# Patient Record
Sex: Male | Born: 1960 | Race: White | Hispanic: No | Marital: Married | Smoking: Former smoker
Health system: Southern US, Community
[De-identification: ages and names within clinical notes are randomized; demographics above are authoritative.]

## PROBLEM LIST (undated history)

## (undated) DIAGNOSIS — N400 Enlarged prostate without lower urinary tract symptoms: Secondary | ICD-10-CM

## (undated) DIAGNOSIS — N179 Acute kidney failure, unspecified: Secondary | ICD-10-CM

## (undated) DIAGNOSIS — I10 Essential (primary) hypertension: Secondary | ICD-10-CM

## (undated) DIAGNOSIS — R0789 Other chest pain: Secondary | ICD-10-CM

## (undated) DIAGNOSIS — R0609 Other forms of dyspnea: Secondary | ICD-10-CM

## (undated) DIAGNOSIS — E785 Hyperlipidemia, unspecified: Secondary | ICD-10-CM

## (undated) HISTORY — DX: Other forms of dyspnea: R06.09

## (undated) HISTORY — PX: OTHER SURGICAL HISTORY: SHX169

## (undated) HISTORY — DX: Hyperlipidemia, unspecified: E78.5

## (undated) HISTORY — DX: Other chest pain: R07.89

## (undated) HISTORY — DX: Essential (primary) hypertension: I10

## (undated) HISTORY — DX: Benign prostatic hyperplasia without lower urinary tract symptoms: N40.0

---

## 2015-07-12 ENCOUNTER — Encounter: Payer: Self-pay | Admitting: Cardiovascular Disease

## 2015-07-12 ENCOUNTER — Ambulatory Visit (INDEPENDENT_AMBULATORY_CARE_PROVIDER_SITE_OTHER): Payer: Managed Care, Other (non HMO) | Admitting: Cardiovascular Disease

## 2015-07-12 VITALS — BP 129/85 | HR 69 | Ht 72.0 in | Wt 214.2 lb

## 2015-07-12 DIAGNOSIS — R079 Chest pain, unspecified: Secondary | ICD-10-CM

## 2015-07-12 DIAGNOSIS — I10 Essential (primary) hypertension: Secondary | ICD-10-CM

## 2015-07-12 DIAGNOSIS — E785 Hyperlipidemia, unspecified: Secondary | ICD-10-CM | POA: Diagnosis not present

## 2015-07-12 DIAGNOSIS — N400 Enlarged prostate without lower urinary tract symptoms: Secondary | ICD-10-CM | POA: Diagnosis not present

## 2015-07-12 HISTORY — DX: Benign prostatic hyperplasia without lower urinary tract symptoms: N40.0

## 2015-07-12 HISTORY — DX: Essential (primary) hypertension: I10

## 2015-07-12 NOTE — Progress Notes (Signed)
Cardiology Office Note   Date:  07/12/2015   ID:  Nicholas PostinWilliam A Hanger, DOB 02-07-1961, MRN [id number]  PCP:  No PCP Per Patient  Cardiologist:   Chilton Siiffany Plum, MD   Chief Complaint  Patient presents with  . New Patient (Initial Visit)    SOB;none. CHEST PAIN;tighting.LIGHTHEADED/DIZZINESS; none. PAIN OR CRAMPING IN LEGS; restless legs. EDEMA; none.      History of Present Illness: Nicholas Coleman is a 55 y.o. male with hypertension and hyperlipidemia who presents for and evaluation of chest discomfort.  In the last 3-4 weeks he has noted several episodes of mild, 1/10 chest discomfort.  The episodes occur and last for a few minutes at a time. There is no associated shortness of breath, lightheadedness, dizziness, nausea, vomiting or diaphoresis.  The chest discomfort does not radiate and is localized substernally.  It occurs both at rest and with exertion. He does not get much exercise but notes that he does a lot of walking at work, which is not make her.  Mr. Beulah GandyRudolph alternates between working day and night shifts every month.  When he works the night shift he does not use his CPAP.  He notes that he has been stressed lately as he recently [place] from [place] and got married.  He has also started a new job.  He wonders if these stressors may be the cause of his chest discomfort.  He denies lower extremity edema, orthopnea or PND.    Past Medical History  Diagnosis Date  . Hyperlipidemia   . Hypertension   . Essential hypertension 07/12/2015  . BPH (benign prostatic hyperplasia) 07/12/2015    No past surgical history on file.   Current Outpatient Prescriptions  Medication Sig Dispense Refill  . Digestive Enzymes (SIMILASE PO) Take 1 tablet by mouth 2 (two) times daily with a meal.    . hydrochlorothiazide (HYDRODIURIL) 25 MG tablet Take 1 tablet by mouth daily.    . Multiple Vitamin (MULTIVITAMIN) capsule Take 1 capsule by mouth daily.    . Omega-3 Fatty  Acids (FISH OIL PO) Take 2 capsules by mouth daily.    . Probiotic Product (PROBIOTIC DAILY PO) Take 1 tablet by mouth daily.    . simvastatin (ZOCOR) 20 MG tablet Take 1 tablet by mouth daily.    . tamsulosin (FLOMAX) 0.4 MG CAPS capsule      No current facility-administered medications for this visit.    Allergies:   Review of patient's allergies indicates no known allergies.    Social History:  The patient  reports that he has quit smoking. He does not have any smokeless tobacco history on file.   Family History:  The patient's family history includes CAD in his father; Lymphoma in his mother; Stroke in his father.    ROS:  Please see the history of present illness.   Otherwise, review of systems are positive for none.   All other systems are reviewed and negative.    PHYSICAL EXAM: VS:  BP 129/85 mmHg  Pulse 69  Ht 6' (1.829 m)  Wt 97.16 kg (214 lb 3.2 oz)  BMI 29.04 kg/m2 , BMI Body mass index is 29.04 kg/(m^2). GENERAL:  Well appearing HEENT:  Pupils equal round and reactive, fundi not visualized, oral mucosa unremarkable NECK:  No jugular venous distention, waveform within normal limits, carotid upstroke brisk and symmetric, no bruits, no thyromegaly LYMPHATICS:  No cervical adenopathy LUNGS:  Clear to auscultation bilaterally HEART:  RRR.  PMI not displaced or sustained,S1 and S2 within normal limits, no S3, no S4, no clicks, no rubs, no murmurs ABD:  Flat, positive bowel sounds normal in frequency in pitch, no bruits, no rebound, no guarding, no midline pulsatile mass, no hepatomegaly, no splenomegaly EXT:  2 plus pulses throughout, no edema, no cyanosis no clubbing SKIN:  No rashes no nodules NEURO:  Cranial nerves II through XII grossly intact, motor grossly intact throughout PSYCH:  Cognitively intact, oriented to person place and time    EKG:  EKG is ordered today. The ekg ordered today demonstrates sinus rhythm. Rate 69 bpm.   Recent Labs: No results found  for requested labs within last 365 days.    Lipid Panel No results found for: CHOL, TRIG, HDL, CHOLHDL, VLDL, LDLCALC, LDLDIRECT    Wt Readings from Last 3 Encounters:  07/12/15 97.16 kg (214 lb 3.2 oz)      ASSESSMENT AND PLAN:  # Atypical chest pain: Mr. Schuman's chest pain is atypical for coronary artery disease. However, he has risk factors including hypertension and hyperlipidemia. We will obtain a treadmill stress test to evaluate for ischemia.  # Hypertension: Blood pressure is well-controlled on hydrochlorothiazide.  # Hyperlipidemia: Continue simvastatin. His lipids are managed by his PCP.   # OSA: Patient was encouraged to wear his CPAP whenever sleeping.    Current medicines are reviewed at length with the patient today.  The patient does not have concerns regarding medicines.  The following changes have been made:  no change  Labs/ tests ordered today include:   Orders Placed This Encounter  Procedures  . CBC with Differential/Platelet  . Lipid Profile  . Comprehensive Metabolic Panel (CMET)  . Exercise Tolerance Test  . EKG 12-Lead     Disposition:   FU with Phyllis Whitefield C. Duke Salvia, MD, [facility] in 1 year   This note was written with the assistance of speech recognition software.  Please excuse any transcriptional errors.  Signed, Keara Pagliarulo C. Duke Salvia, MD, [facility]  07/12/2015 1:44 PM    [facility]

## 2015-07-12 NOTE — Patient Instructions (Addendum)
Medication Instructions:  Your physician recommends that you continue on your current medications as directed. Please refer to the Current Medication list given to you today.  Labwork: FASTING CBC/LP/CMET AT [facility] ON THE FIRST FLOOR SOON  Testing/Procedures: Your physician has requested that you have an exercise tolerance test. For further information please visit [email or web address] Please also follow instruction sheet, as given.  Follow-Up: Your physician wants you to follow-up in: 1 YEAR OV You will receive a reminder letter in the mail two months in advance. If you don't receive a letter, please call our office to schedule the follow-up appointment.  If you need a refill on your cardiac medications before your next appointment, please call your pharmacy.

## 2015-07-22 ENCOUNTER — Telehealth (HOSPITAL_COMMUNITY): Payer: Self-pay

## 2015-07-22 NOTE — Telephone Encounter (Signed)
Encounter complete. 

## 2015-07-23 ENCOUNTER — Telehealth: Payer: Self-pay | Admitting: Cardiovascular Disease

## 2015-07-23 DIAGNOSIS — R079 Chest pain, unspecified: Secondary | ICD-10-CM

## 2015-07-23 NOTE — Telephone Encounter (Signed)
[facility] orders created for patient at his request, patient aware.

## 2015-07-23 NOTE — Telephone Encounter (Signed)
Nicholas Coleman is asking that a Lab Order be sent to [name] on [address] , he has an appointment with them on 07/27/15 at 8am . Please call if you have any questions .   Thanks

## 2015-07-27 ENCOUNTER — Ambulatory Visit (HOSPITAL_COMMUNITY)
Admission: RE | Admit: 2015-07-27 | Discharge: 2015-07-27 | Disposition: A | Discharge status: Home / Self Care | Payer: Managed Care, Other (non HMO) | Source: Ambulatory Visit | Attending: Internal Medicine | Admitting: Internal Medicine

## 2015-07-27 DIAGNOSIS — R079 Chest pain, unspecified: Secondary | ICD-10-CM | POA: Diagnosis not present

## 2015-07-27 LAB — EXERCISE TOLERANCE TEST
CHL CUP MPHR: 164 {beats}/min
CHL CUP RESTING HR STRESS: 74 {beats}/min
CHL RATE OF PERCEIVED EXERTION: 17
CSEPEDS: 1 s
CSEPHR: 94 %
CSEPPHR: 155 {beats}/min
Estimated workload: 13.4 METS
Exercise duration (min): 12 min

## 2015-07-28 LAB — COMPREHENSIVE METABOLIC PANEL
A/G RATIO: 1.9 (ref 1.2–2.2)
ALBUMIN: 4.6 g/dL (ref 3.5–5.5)
ALT: 31 IU/L (ref 0–44)
AST: 24 IU/L (ref 0–40)
Alkaline Phosphatase: 50 IU/L (ref 39–117)
BILIRUBIN TOTAL: 0.6 mg/dL (ref 0.0–1.2)
BUN / CREAT RATIO: 19 (ref 9–20)
BUN: 23 mg/dL (ref 6–24)
CHLORIDE: 98 mmol/L (ref 96–106)
CO2: 26 mmol/L (ref 18–29)
Calcium: 9.3 mg/dL (ref 8.7–10.2)
Creatinine, Ser: 1.19 mg/dL (ref 0.76–1.27)
GFR calc non Af Amer: 68 mL/min/{1.73_m2} (ref >59)
GFR, EST AFRICAN AMERICAN: 78 mL/min/{1.73_m2} (ref >59)
GLOBULIN, TOTAL: 2.4 g/dL (ref 1.5–4.5)
Glucose: 101 mg/dL — ABNORMAL HIGH (ref 65–99)
POTASSIUM: 4.4 mmol/L (ref 3.5–5.2)
SODIUM: 140 mmol/L (ref 134–144)
TOTAL PROTEIN: 7 g/dL (ref 6.0–8.5)

## 2015-07-28 LAB — CBC WITH DIFFERENTIAL/PLATELET
BASOS ABS: 0 10*3/uL (ref 0.0–0.2)
Basos: 0 %
EOS (ABSOLUTE): 0.3 10*3/uL (ref 0.0–0.4)
Eos: 5 %
HEMOGLOBIN: 16.2 g/dL (ref 12.6–17.7)
Hematocrit: 46 % (ref 37.5–51.0)
Immature Grans (Abs): 0 10*3/uL (ref 0.0–0.1)
Immature Granulocytes: 1 %
LYMPHS ABS: 1.8 10*3/uL (ref 0.7–3.1)
Lymphs: 30 %
MCH: 31 pg (ref 26.6–33.0)
MCHC: 35.2 g/dL (ref 31.5–35.7)
MCV: 88 fL (ref 79–97)
MONOCYTES: 8 %
Monocytes Absolute: 0.5 10*3/uL (ref 0.1–0.9)
NEUTROS ABS: 3.3 10*3/uL (ref 1.4–7.0)
Neutrophils: 56 %
Platelets: 198 10*3/uL (ref 150–379)
RBC: 5.23 x10E6/uL (ref 4.14–5.80)
RDW: 13 % (ref 12.3–15.4)
WBC: 5.9 10*3/uL (ref 3.4–10.8)

## 2015-07-28 LAB — LIPID PANEL
CHOL/HDL RATIO: 3.3 ratio (ref 0.0–5.0)
CHOLESTEROL TOTAL: 225 mg/dL — AB (ref 100–199)
HDL: 69 mg/dL (ref >39)
LDL Calculated: 143 mg/dL — ABNORMAL HIGH (ref 0–99)
Triglycerides: 65 mg/dL (ref 0–149)
VLDL Cholesterol Cal: 13 mg/dL (ref 5–40)

## 2015-08-03 ENCOUNTER — Telehealth: Payer: Self-pay | Admitting: *Deleted

## 2015-08-03 NOTE — Telephone Encounter (Signed)
-----   Message from Chilton Siiffany Oneida, MD sent at 07/28/2015  8:47 PM EDT ----- Kidney function and electrolytes are normal.  Normal blood counts.  Cholesterol levels are elevated.  Recommend increasing simvastatin to 40mg .  Check lipids and LFTs in 6 weeks.

## 2015-11-22 DIAGNOSIS — F5232 Male orgasmic disorder: Secondary | ICD-10-CM | POA: Insufficient documentation

## 2015-11-22 DIAGNOSIS — N529 Male erectile dysfunction, unspecified: Secondary | ICD-10-CM | POA: Insufficient documentation

## 2015-12-22 DIAGNOSIS — K642 Third degree hemorrhoids: Secondary | ICD-10-CM | POA: Insufficient documentation

## 2017-08-07 ENCOUNTER — Other Ambulatory Visit: Payer: Self-pay | Admitting: Gastroenterology

## 2017-08-07 DIAGNOSIS — R1011 Right upper quadrant pain: Secondary | ICD-10-CM

## 2017-08-21 ENCOUNTER — Encounter (HOSPITAL_COMMUNITY)
Admission: RE | Admit: 2017-08-21 | Discharge: 2017-08-21 | Disposition: A | Discharge status: Home / Self Care | Payer: Managed Care, Other (non HMO) | Source: Ambulatory Visit | Attending: Gastroenterology | Admitting: Gastroenterology

## 2017-08-21 DIAGNOSIS — R1011 Right upper quadrant pain: Secondary | ICD-10-CM | POA: Diagnosis not present

## 2017-08-21 MED ORDER — TECHNETIUM TC 99M MEBROFENIN IV KIT
5.0000 | PACK | Freq: Once | INTRAVENOUS | Status: AC | PRN
  Administered 2017-08-21: 5 via INTRAVENOUS

## 2017-11-06 ENCOUNTER — Other Ambulatory Visit: Payer: Self-pay | Admitting: Gastroenterology

## 2017-11-06 DIAGNOSIS — R112 Nausea with vomiting, unspecified: Secondary | ICD-10-CM

## 2017-11-19 ENCOUNTER — Ambulatory Visit
Admission: RE | Admit: 2017-11-19 | Discharge: 2017-11-19 | Disposition: A | Discharge status: Home / Self Care | Payer: Managed Care, Other (non HMO) | Source: Ambulatory Visit | Attending: Gastroenterology | Admitting: Gastroenterology

## 2017-11-19 DIAGNOSIS — R112 Nausea with vomiting, unspecified: Secondary | ICD-10-CM

## 2017-11-19 MED ORDER — IOPAMIDOL (ISOVUE-300) INJECTION 61%
100.0000 mL | Freq: Once | INTRAVENOUS | Status: AC | PRN
  Administered 2017-11-19: 100 mL via INTRAVENOUS

## 2017-12-07 NOTE — Telephone Encounter (Signed)
Notes Recorded by Burnell Blanks on 08/03/2015 at 11:37 AM Advised patient of lab results  He does not know that he wants to come back for a recheck regarding labs after making the medication change Explained to him that the increased Simvastatin could effect the liver and need to monitor to avoid damaging the liver Patient stated he would think about it and call back ------

## 2017-12-11 ENCOUNTER — Encounter: Payer: Self-pay | Admitting: Cardiovascular Disease

## 2017-12-11 ENCOUNTER — Ambulatory Visit: Payer: Managed Care, Other (non HMO) | Admitting: Cardiovascular Disease

## 2017-12-11 VITALS — BP 113/77 | HR 83 | Ht 72.0 in | Wt 224.0 lb

## 2017-12-11 DIAGNOSIS — I10 Essential (primary) hypertension: Secondary | ICD-10-CM | POA: Diagnosis not present

## 2017-12-11 DIAGNOSIS — R0789 Other chest pain: Secondary | ICD-10-CM

## 2017-12-11 DIAGNOSIS — R0602 Shortness of breath: Secondary | ICD-10-CM | POA: Diagnosis not present

## 2017-12-11 DIAGNOSIS — E7800 Pure hypercholesterolemia, unspecified: Secondary | ICD-10-CM | POA: Diagnosis not present

## 2017-12-11 DIAGNOSIS — R0600 Dyspnea, unspecified: Secondary | ICD-10-CM

## 2017-12-11 DIAGNOSIS — R0609 Other forms of dyspnea: Secondary | ICD-10-CM | POA: Insufficient documentation

## 2017-12-11 HISTORY — DX: Other forms of dyspnea: R06.09

## 2017-12-11 HISTORY — DX: Other chest pain: R07.89

## 2017-12-11 HISTORY — DX: Dyspnea, unspecified: R06.00

## 2017-12-11 MED ORDER — METOPROLOL TARTRATE 100 MG PO TABS: ORAL_TABLET | ORAL | 0 refills | Status: DC

## 2017-12-11 NOTE — Patient Instructions (Addendum)
Medication Instructions:  DECREASE YOUR HYDROCHLOROTHIAZIDE TO 25 MG 1/2 TABLET DAILY   TAKE METOPROLOL 100 MG 2 HOURS PRIOR TO CARDIAC CT  Labwork: BMET 1 WEEK PRIOR TO CT   Testing/Procedures: Your physician has requested that you have cardiac CT. Cardiac computed tomography (CT) is a painless test that uses an x-ray machine to take clear, detailed pictures of your heart. For further information please visit [email or web address] Please follow instruction sheet as given. THE OFFICE WILL CALL YOU TO SCHEDULE ONCE THIS HAS BEEN APPROVED BY YOUR INSURANCE   Your physician has requested that you have an echocardiogram. Echocardiography is a painless test that uses sound waves to create images of your heart. It provides your doctor with information about the size and shape of your heart and how well your heart's chambers and valves are working. This procedure takes approximately one hour. There are no restrictions for this procedure. AT [place] DAY YOU HAVE YOUR CT  Follow-Up: Your physician recommends that you schedule a follow-up appointment in: 6 WEEKS   Any Other Special Instructions Will Be Listed Below (If Applicable).  MONITOR YOUR AND LOG YOUR BLOOD PRESSURE, BRING READINGS TO FOLLOW UP   Please arrive at the [facility] main entrance of [facility] at xx:xx AM (30-45 minutes prior to test start time)  [facility] [address] [place], [place] [address] [phone]  Proceed to the [facility] Radiology Department (First Floor).  Please follow these instructions carefully (unless otherwise directed):  Hold all erectile dysfunction medications at least 48 hours prior to test.  On the Night Before the Test: . Be sure to Drink plenty of water. . Do not consume any caffeinated/decaffeinated beverages or chocolate 12 hours prior to your test. . Do not take any antihistamines 12 hours prior to your test. . If you take Metformin do not take 24 hours prior  to test. . If the patient has contrast allergy: ? Patient will need a prescription for Prednisone and very clear instructions (as follows): 1. Prednisone 50 mg - take 13 hours prior to test 2. Take another Prednisone 50 mg 7 hours prior to test 3. Take another Prednisone 50 mg 1 hour prior to test 4. Take Benadryl 50 mg 1 hour prior to test . Patient must complete all four doses of above prophylactic medications. . Patient will need a ride after test due to Benadryl.  On the Day of the Test: . Drink plenty of water. Do not drink any water within one hour of the test. . Do not eat any food 4 hours prior to the test. . You may take your regular medications prior to the test. . IF NOT ON BETA BLOCKER-Take 100 mg of Lopressor (metoprolol) TWO HOURS before test  After the Test: . Drink plenty of water. . After receiving IV contrast, you may experience a mild flushed feeling. This is normal. . On occasion, you may experience a mild rash up to 24 hours after the test. This is not dangerous. If this occurs, you can take Benadryl 25 mg and increase your fluid intake. . If you experience trouble breathing, this can be serious. If it is severe call 911 IMMEDIATELY. If it is mild, please call our office. . If you take any of these medications: Glipizide/Metformin, Avandament, Glucavance, please do not take 48 hours after completing test.    Cardiac CT Angiogram A cardiac CT angiogram is a procedure to look at the heart and the area around the  heart. It may be done to help find the cause of chest pains or other symptoms of heart disease. During this procedure, a large X-ray machine, called a CT scanner, takes detailed pictures of the heart and the surrounding area after a dye (contrast material) has been injected into blood vessels in the area. The procedure is also sometimes called a coronary CT angiogram, coronary artery scanning, or CTA. A cardiac CT angiogram allows the health care provider to see  how well blood is flowing to and from the heart. The health care provider will be able to see if there are any problems, such as:  Blockage or narrowing of the coronary arteries in the heart.  Fluid around the heart.  Signs of weakness or disease in the muscles, valves, and tissues of the heart.  Tell a health care provider about:  Any allergies you have. This is especially important if you have had a previous allergic reaction to contrast dye.  All medicines you are taking, including vitamins, herbs, eye drops, creams, and over-the-counter medicines.  Any blood disorders you have.  Any surgeries you have had.  Any medical conditions you have.  Whether you are pregnant or may be pregnant.  Any anxiety disorders, chronic pain, or other conditions you have that may increase your stress or prevent you from lying still. What are the risks? Generally, this is a safe procedure. However, problems may occur, including:  Bleeding.  Infection.  Allergic reactions to medicines or dyes.  Damage to other structures or organs.  Kidney damage from the dye or contrast that is used.  Increased risk of cancer from radiation exposure. This risk is low. Talk with your health care provider about: ? The risks and benefits of testing. ? How you can receive the lowest dose of radiation.  What happens before the procedure?  Wear comfortable clothing and remove any jewelry, glasses, dentures, and hearing aids.  Follow instructions from your health care provider about eating and drinking. This may include: ? For 12 hours before the test - avoid caffeine. This includes tea, coffee, soda, energy drinks, and diet pills. Drink plenty of water or other fluids that do not have caffeine in them. Being well-hydrated can prevent complications. ? For 4-6 hours before the test - stop eating and drinking. The contrast dye can cause nausea, but this is less likely if your stomach is empty.  Ask your health  care provider about changing or stopping your regular medicines. This is especially important if you are taking diabetes medicines, blood thinners, or medicines to treat erectile dysfunction. What happens during the procedure?  Hair on your chest may need to be removed so that small sticky patches called electrodes can be placed on your chest. These will transmit information that helps to monitor your heart during the test.  An IV tube will be inserted into one of your veins.  You might be given a medicine to control your heart rate during the test. This will help to ensure that good images are obtained.  You will be asked to lie on an exam table. This table will slide in and out of the CT machine during the procedure.  Contrast dye will be injected into the IV tube. You might feel warm, or you may get a metallic taste in your mouth.  You will be given a medicine (nitroglycerin) to relax (dilate) the arteries in your heart.  The table that you are lying on will move into the CT machine tunnel  for the scan.  The person running the machine will give you instructions while the scans are being done. You may be asked to: ? Keep your arms above your head. ? Hold your breath. ? Stay very still, even if the table is moving.  When the scanning is complete, you will be moved out of the machine.  The IV tube will be removed. The procedure may vary among health care providers and hospitals. What happens after the procedure?  You might feel warm, or you may get a metallic taste in your mouth from the contrast dye.  You may have a headache from the nitroglycerin.  After the procedure, drink water or other fluids to wash (flush) the contrast material out of your body.  Contact a health care provider if you have any symptoms of allergy to the contrast. These symptoms include: ? Shortness of breath. ? Rash or hives. ? A racing heartbeat.  Most people can return to their normal activities right  after the procedure. Ask your health care provider what activities are safe for you.  It is up to you to get the results of your procedure. Ask your health care provider, or the department that is doing the procedure, when your results will be ready. Summary  A cardiac CT angiogram is a procedure to look at the heart and the area around the heart. It may be done to help find the cause of chest pains or other symptoms of heart disease.  During this procedure, a large X-ray machine, called a CT scanner, takes detailed pictures of the heart and the surrounding area after a dye (contrast material) has been injected into blood vessels in the area.  Ask your health care provider about changing or stopping your regular medicines before the procedure. This is especially important if you are taking diabetes medicines, blood thinners, or medicines to treat erectile dysfunction.  After the procedure, drink water or other fluids to wash (flush) the contrast material out of your body. This information is not intended to replace advice given to you by your health care provider. Make sure you discuss any questions you have with your health care provider. Document Released: 02/10/2008 Document Revised: 01/17/2016 Document Reviewed: 01/17/2016 Elsevier Interactive Patient Education  2017 [employer] Inc.   Echocardiogram An echocardiogram, or echocardiography, uses sound waves (ultrasound) to produce an image of your heart. The echocardiogram is simple, painless, obtained within a short period of time, and offers valuable information to your health care provider. The images from an echocardiogram can provide information such as:  Evidence of coronary artery disease (CAD).  Heart size.  Heart muscle function.  Heart valve function.  Aneurysm detection.  Evidence of a past heart attack.  Fluid buildup around the heart.  Heart muscle thickening.  Assess heart valve function.  Tell a health care  provider about:  Any allergies you have.  All medicines you are taking, including vitamins, herbs, eye drops, creams, and over-the-counter medicines.  Any problems you or family members have had with anesthetic medicines.  Any blood disorders you have.  Any surgeries you have had.  Any medical conditions you have.  Whether you are pregnant or may be pregnant. What happens before the procedure? No special preparation is needed. Eat and drink normally. What happens during the procedure?  In order to produce an image of your heart, gel will be applied to your chest and a wand-like tool (transducer) will be moved over your chest. The gel will help transmit  the sound waves from the transducer. The sound waves will harmlessly bounce off your heart to allow the heart images to be captured in real-time motion. These images will then be recorded.  You may need an IV to receive a medicine that improves the quality of the pictures. What happens after the procedure? You may return to your normal schedule including diet, activities, and medicines, unless your health care provider tells you otherwise. This information is not intended to replace advice given to you by your health care provider. Make sure you discuss any questions you have with your health care provider. Document Released: 02/25/2000 Document Revised: 10/16/2015 Document Reviewed: 11/04/2012 Elsevier Interactive Patient Education  2017 [employer].

## 2017-12-11 NOTE — Progress Notes (Signed)
Cardiology Office Note   Date:  12/11/2017   ID:  Nicholas Coleman, DOB [birth year], MRN [id number]  PCP:  Wilfrid Lund, PA  Cardiologist:   Chilton Si, MD   Chief Complaint  Patient presents with  . Follow-up     History of Present Illness: Nicholas Coleman is a 57 y.o. male with hypertension and hyperlipidemia who presents for follow up.  He was initially seen 07/2015 for the evaluation of chest discomfort.  His symptoms were atypical.  However given that he had risk factors he was referred for an ETT 07/2015 that was negative for ischemia.  He achieved 13.4 METS on a Bruce protocol.  At that time lipids were checked and it was recommended that he increase his simvastatin.  He never returned for follow-up testing.  For the last several months Nicholas Coleman has developed new symptoms.  He has been dealing with abdominal discomfort.  It first occurred when he started doing a low-fat carb high fat diet.  He noted that his gallbladder "flared up."  He stopped that diet and since then he has had diffuse abdominal pain.  He gets pain and nauseous before eating.  After he eats he feels very bloated.  He had an abdominal ultrasound and CT that did not reveal any significant pathology.  He reports that his GI labs have been unremarkable.  He is scheduled to have an upper and lower endoscopy next week.  His primary care provider tried him on a PPI but he has not noted any changes in his symptoms.  He has no melena or hematochezia.  For the last 3 months he is also noted that he is more lightheaded when he bends over and stands up or bends over.  He does not check his blood pressure at home but notes that it is always well-controlled when he sees a provider.  He is noted increased shortness of breath especially when going up and down steps.  He walks over 10,000 steps each day at work.  He has substernal chest pressure that sometimes happens with exertion and sometimes at rest.  He is always  sweating a lot but notices that he has more diaphoresis with exertion than usual.   Past Medical History:  Diagnosis Date  . Atypical chest pain 12/11/2017  . BPH (benign prostatic hyperplasia) 07/12/2015  . Essential hypertension 07/12/2015  . Exertional dyspnea 12/11/2017  . Hyperlipidemia   . Hypertension     History reviewed. No pertinent surgical history.   Current Outpatient Medications  Medication Sig Dispense Refill  . Boswellia-Glucosamine-Vit D (GLUCOSAMINE COMPLEX PO) Take by mouth.    . Coenzyme Q10 (COQ10 PO) Take by mouth daily.    . hydrochlorothiazide (HYDRODIURIL) 25 MG tablet Take 1 tablet by mouth as directed. TAKE 1/2 TABLET BY MOUTH DAILY    . MAGNESIUM MALATE PO Take by mouth.    . Multiple Vitamin (MULTIVITAMIN) capsule Take 1 capsule by mouth daily.    . Omega-3 Fatty Acids (FISH OIL PO) Take 2 capsules by mouth daily.    . simvastatin (ZOCOR) 40 MG tablet     . metoprolol tartrate (LOPRESSOR) 100 MG tablet TAKE 1 TABLET 2 HOURS PRIOR TO CT 1 tablet 0   No current facility-administered medications for this visit.     Allergies:   Patient has no known allergies.    Social History:  The patient  reports that he has quit smoking. He has never used smokeless tobacco.  Family History:  The patient's family history includes CAD in his father; Lymphoma in his mother; Stroke in his father.    ROS:  Please see the history of present illness.   Otherwise, review of systems are positive for none.   All other systems are reviewed and negative.    PHYSICAL EXAM: VS:  BP 113/77   Pulse 83   Ht 6' (1.829 m)   Wt 224 lb (101.6 kg)   BMI 30.38 kg/m  , BMI Body mass index is 30.38 kg/m. GENERAL:  Well appearing HEENT:  Pupils equal round and reactive, fundi not visualized, oral mucosa unremarkable NECK:  No jugular venous distention, waveform within normal limits, carotid upstroke brisk and symmetric, no bruits, no thyromegaly LYMPHATICS:  No cervical  adenopathy LUNGS:  Clear to auscultation bilaterally HEART:  RRR.  PMI not displaced or sustained,S1 and S2 within normal limits, no S3, no S4, no clicks, no rubs, no murmurs ABD:  Flat, positive bowel sounds normal in frequency in pitch, no bruits, no rebound, no guarding, no midline pulsatile mass, no hepatomegaly, no splenomegaly EXT:  2 plus pulses throughout, no edema, no cyanosis no clubbing SKIN:  No rashes no nodules NEURO:  Cranial nerves II through XII grossly intact, motor grossly intact throughout PSYCH:  Cognitively intact, oriented to person place and time   EKG:  EKG is ordered today. The ekg ordered 07/12/2015 demonstrates sinus rhythm. Rate 69 bpm. 12/11/2017: Sinus rhythm.  Rate 83 bpm.  ETT 07/27/15:  Blood pressure demonstrated a hypotensive response to exercise.  Upsloping ST segment depression ST segment depression was noted during stress in the II, III, aVF, V4, V5 and V6 leads.  Excellent exercise capacity.  Negative, adequate stress test.  Recent Labs: No results found for requested labs within last 8760 hours.    Lipid Panel    Component Value Date/Time   CHOL 225 (H) 07/27/2015 0752   TRIG 65 07/27/2015 0752   HDL 69 07/27/2015 0752   CHOLHDL 3.3 07/27/2015 0752   LDLCALC 143 (H) 07/27/2015 0752      Wt Readings from Last 3 Encounters:  12/11/17 224 lb (101.6 kg)  07/12/15 214 lb 3.2 oz (97.2 kg)      ASSESSMENT AND PLAN:  # Atypical chest pain: Symptoms are concerning for angina.  However he also has symptoms at rest.  He previously had an ETT in 2017 that was unremarkable.  We will get a coronary CT-a to assess his coronaries.  # Shortness of breath: Check echo and cardiac CT-A.  He is euvolemic on exam.  # Hypertension:  # Orthostatic symptoms: Blood pressure is well-controlled on hydrochlorothiazide.  However he has orthostatic symptoms.  We will reduce hydrochlorothiazide to 12.5 mg and he will check his blood pressure at home.  His  goal is less than 130/80.  # Hyperlipidemia: Continue simvastatin. We will get a copy of his lipids from his PCP.  # OSA: Patient was encouraged to wear his CPAP whenever sleeping.    Current medicines are reviewed at length with the patient today.  The patient does not have concerns regarding medicines.  The following changes have been made:  no change  Labs/ tests ordered today include:   Orders Placed This Encounter  Procedures  . CT CORONARY MORPH W/CTA COR W/SCORE W/CA W/CM &/OR WO/CM  . CT CORONARY FRACTIONAL FLOW RESERVE DATA PREP  . CT CORONARY FRACTIONAL FLOW RESERVE FLUID ANALYSIS  . Basic metabolic panel  . EKG 12-Lead  .  ECHOCARDIOGRAM COMPLETE     Disposition:   FU with Brytney Somes C. Duke Salvia, MD, [facility] in 6 weeks.    Signed, Angelys Yetman C. Duke Salvia, MD, [facility]  12/11/2017 12:37 PM     [facility]

## 2017-12-12 ENCOUNTER — Other Ambulatory Visit: Payer: Self-pay

## 2017-12-12 ENCOUNTER — Ambulatory Visit (HOSPITAL_COMMUNITY): Payer: Managed Care, Other (non HMO) | Attending: Cardiology

## 2017-12-12 DIAGNOSIS — R0602 Shortness of breath: Secondary | ICD-10-CM | POA: Insufficient documentation

## 2017-12-12 DIAGNOSIS — R0789 Other chest pain: Secondary | ICD-10-CM | POA: Insufficient documentation

## 2017-12-18 ENCOUNTER — Other Ambulatory Visit: Payer: Self-pay | Admitting: Gastroenterology

## 2017-12-18 DIAGNOSIS — R112 Nausea with vomiting, unspecified: Secondary | ICD-10-CM

## 2018-01-08 ENCOUNTER — Encounter (HOSPITAL_COMMUNITY): Payer: Managed Care, Other (non HMO)

## 2018-01-22 ENCOUNTER — Encounter: Payer: Self-pay | Admitting: Cardiovascular Disease

## 2018-01-22 ENCOUNTER — Ambulatory Visit: Payer: Managed Care, Other (non HMO) | Admitting: Cardiovascular Disease

## 2018-01-22 VITALS — BP 110/72 | HR 92 | Ht 72.0 in | Wt 230.6 lb

## 2018-01-22 DIAGNOSIS — I10 Essential (primary) hypertension: Secondary | ICD-10-CM

## 2018-01-22 DIAGNOSIS — R0789 Other chest pain: Secondary | ICD-10-CM

## 2018-01-22 DIAGNOSIS — R0602 Shortness of breath: Secondary | ICD-10-CM | POA: Diagnosis not present

## 2018-01-22 DIAGNOSIS — E7800 Pure hypercholesterolemia, unspecified: Secondary | ICD-10-CM | POA: Diagnosis not present

## 2018-01-22 NOTE — Progress Notes (Signed)
Cardiology Office Note   Date:  01/22/2018   ID:  Nicholas Coleman, DOB November 14, 1960, MRN [id number]  PCP:  Wilfrid LundBecker, Anna G, PA  Cardiologist:   Chilton Siiffany Palmer, MD   Chief Complaint  Patient presents with  . Follow-up     History of Present Illness: Nicholas Coleman is a 57 y.o. male with hypertension and hyperlipidemia who presents for follow up.  He was initially seen 07/2015 for the evaluation of chest discomfort.  His symptoms were atypical.  However given that he had risk factors he was referred for an ETT 07/2015 that was negative for ischemia.  He achieved 13.4 METS on a Bruce protocol.  At that time lipids were checked and it was recommended that he increase his simvastatin.  He never returned for follow-up testing.  He followed up 12/2017 at which time he was dealing with GI distress after changing his diet.  He had an abdominal ultrasound and CT that did not reveal any significant pathology.  He followed up with GI and has been doing better lately.  At that appointment he also complained of lightheadedness and dizziness after bending over and standing up.  He reported exertional dyspnea.  He was referred for coronary CT-A but never had it due to lack of insurance approval.  He did have an echo 12/2017 that revealed LVEF 55 to 60% with grade 1 diastolic dysfunction and mild LVH.  Due to his orthostatic symptoms hydrochlorothiazide was reduced to 12.5 mg.  He is no longer lightheaded or dizzy.  He has been exercising regularly without exertional dyspnea or chest pain.  He has no lower extremity edema, orthopnea, or PND.  He plans to start a new diet soon.  It will start with 10 days of detox from carbohydrates.  Past Medical History:  Diagnosis Date  . Atypical chest pain 12/11/2017  . BPH (benign prostatic hyperplasia) 07/12/2015  . Essential hypertension 07/12/2015  . Exertional dyspnea 12/11/2017  . Hyperlipidemia   . Hypertension     No past surgical history on file.   Current  Outpatient Medications  Medication Sig Dispense Refill  . Boswellia-Glucosamine-Vit D (GLUCOSAMINE COMPLEX PO) Take by mouth.    . Coenzyme Q10 (COQ10 PO) Take by mouth daily.    . hydrochlorothiazide (HYDRODIURIL) 25 MG tablet Take 1 tablet by mouth as directed. TAKE 1/2 TABLET BY MOUTH DAILY    . MAGNESIUM MALATE PO Take by mouth.    . Multiple Vitamin (MULTIVITAMIN) capsule Take 1 capsule by mouth daily.    . Omega-3 Fatty Acids (FISH OIL PO) Take 2 capsules by mouth daily.    . simvastatin (ZOCOR) 40 MG tablet      No current facility-administered medications for this visit.     Allergies:   Patient has no known allergies.    Social History:  The patient  reports that he has quit smoking. He has never used smokeless tobacco.   Family History:  The patient's family history includes CAD in his father; Lymphoma in his mother; Stroke in his father.    ROS:  Please see the history of present illness.   Otherwise, review of systems are positive for none.   All other systems are reviewed and negative.    PHYSICAL EXAM: VS:  BP 110/72   Pulse 92   Ht 6' (1.829 m)   Wt 230 lb 9.6 oz (104.6 kg)   BMI 31.27 kg/m  , BMI Body mass index is 31.27 kg/m. GENERAL:  Well appearing HEENT: Pupils equal round and reactive, fundi not visualized, oral mucosa unremarkable NECK:  No jugular venous distention, waveform within normal limits, carotid upstroke brisk and symmetric, no bruits LUNGS:  Clear to auscultation bilaterally HEART:  RRR.  PMI not displaced or sustained,S1 and S2 within normal limits, no S3, no S4, no clicks, no rubs, no murmurs ABD:  Flat, positive bowel sounds normal in frequency in pitch, no bruits, no rebound, no guarding, no midline pulsatile mass, no hepatomegaly, no splenomegaly EXT:  2 plus pulses throughout, no edema, no cyanosis no clubbing SKIN:  No rashes no nodules NEURO:  Cranial nerves II through XII grossly intact, motor grossly intact throughout PSYCH:   Cognitively intact, oriented to person place and time   EKG:  EKG is ordered today. The ekg ordered 07/12/2015 demonstrates sinus rhythm. Rate 69 bpm. 12/11/2017: Sinus rhythm.  Rate 83 bpm.  ETT 07/27/15:  Blood pressure demonstrated a hypotensive response to exercise.  Upsloping ST segment depression ST segment depression was noted during stress in the II, III, aVF, V4, V5 and V6 leads.  Excellent exercise capacity.  Negative, adequate stress test.  Echo 12/12/17: Study Conclusions  - Left ventricle: The cavity size was normal. Wall thickness was   increased in a pattern of mild LVH. Systolic function was normal.   The estimated ejection fraction was in the range of 55% to 60%.   Wall motion was normal; there were no regional wall motion   abnormalities. Doppler parameters are consistent with abnormal   left ventricular relaxation (grade 1 diastolic dysfunction).  Impressions:  - Normal LV function; mild LVH; mild diastolic dysfunction.  Recent Labs: No results found for requested labs within last 8760 hours.    Lipid Panel    Component Value Date/Time   CHOL 225 (H) 07/27/2015 0752   TRIG 65 07/27/2015 0752   HDL 69 07/27/2015 0752   CHOLHDL 3.3 07/27/2015 0752   LDLCALC 143 (H) 07/27/2015 0752      Wt Readings from Last 3 Encounters:  01/22/18 230 lb 9.6 oz (104.6 kg)  12/11/17 224 lb (101.6 kg)  07/12/15 214 lb 3.2 oz (97.2 kg)      ASSESSMENT AND PLAN:  # Atypical chest pain: Resolved.  He did not get his coronary CT-A due to lack of insurance coverage.  ETT was nehative 07/2015.  Given that his symptoms have resolved.  We will not pursue further ischemia evaluation at this time.  # Shortness of breath:  Resolved.  Normal systolic function and grade 1 diastolic dysfunction on echo.  # Hypertension:  # Orthostatic symptoms:  Orthostatic symptoms have improved since reducing hydrochlorothiazide.  His blood pressure remains controlled.  #  Hyperlipidemia: Continue simvastatin.  Followed by his PCP.  # OSA: Patient was encouraged to wear his CPAP whenever sleeping.    Current medicines are reviewed at length with the patient today.  The patient does not have concerns regarding medicines.  The following changes have been made:  no change  Labs/ tests ordered today include:   No orders of the defined types were placed in this encounter.    Disposition:   FU with Zilpha Mcandrew C. Duke Salvia, MD, [facility] in 1 year.    Signed, Kelley Knoth C. Duke Salvia, MD, [facility]  01/22/2018 3:46 PM    [facility]

## 2018-01-22 NOTE — Patient Instructions (Signed)
Medication Instructions:  Your physician recommends that you continue on your current medications as directed. Please refer to the Current Medication list given to you today.  If you need a refill on your cardiac medications before your next appointment, please call your pharmacy.   Lab work: NONE  Testing/Procedures: NONE   Follow-Up: At [facility], you and your health needs are our priority.  As part of our continuing mission to provide you with exceptional heart care, we have created designated Provider Care Teams.  These Care Teams include your primary Cardiologist (physician) and Advanced Practice Providers (APPs -  Physician Assistants and Nurse Practitioners) who all work together to provide you with the care you need, when you need it. You will need a follow up appointment in 12 months.  Please call our office 2 months in advance to schedule this appointment.  You may see DR Inyokern  or one of the following Advanced Practice Providers on your designated Care Team:   Luke Kilroy, PA-C Krista Kroeger, PA-C . Callie Goodrich, PA-C    

## 2018-01-24 ENCOUNTER — Telehealth: Payer: Self-pay | Admitting: *Deleted

## 2018-01-24 NOTE — Telephone Encounter (Signed)
Patient was supposed to have cardiac CT. Per Dr Duke Salviaandolph patient feeling better and insurance denied, cancelled order

## 2018-01-29 ENCOUNTER — Encounter: Payer: Self-pay | Admitting: Cardiovascular Disease

## 2018-02-04 ENCOUNTER — Inpatient Hospital Stay (HOSPITAL_COMMUNITY)
Admission: EM | Admit: 2018-02-04 | Discharge: 2018-02-07 | DRG: 312 | Disposition: A | Discharge status: Home / Self Care | Payer: Managed Care, Other (non HMO) | Attending: Internal Medicine | Admitting: Internal Medicine

## 2018-02-04 ENCOUNTER — Encounter (HOSPITAL_COMMUNITY): Payer: Self-pay | Admitting: Emergency Medicine

## 2018-02-04 ENCOUNTER — Other Ambulatory Visit: Payer: Self-pay

## 2018-02-04 ENCOUNTER — Emergency Department (HOSPITAL_COMMUNITY): Payer: Managed Care, Other (non HMO)

## 2018-02-04 DIAGNOSIS — Z8249 Family history of ischemic heart disease and other diseases of the circulatory system: Secondary | ICD-10-CM

## 2018-02-04 DIAGNOSIS — I951 Orthostatic hypotension: Secondary | ICD-10-CM | POA: Diagnosis not present

## 2018-02-04 DIAGNOSIS — I129 Hypertensive chronic kidney disease with stage 1 through stage 4 chronic kidney disease, or unspecified chronic kidney disease: Secondary | ICD-10-CM | POA: Diagnosis present

## 2018-02-04 DIAGNOSIS — R1084 Generalized abdominal pain: Secondary | ICD-10-CM | POA: Diagnosis present

## 2018-02-04 DIAGNOSIS — N179 Acute kidney failure, unspecified: Secondary | ICD-10-CM

## 2018-02-04 DIAGNOSIS — E871 Hypo-osmolality and hyponatremia: Secondary | ICD-10-CM

## 2018-02-04 DIAGNOSIS — E860 Dehydration: Secondary | ICD-10-CM | POA: Diagnosis present

## 2018-02-04 DIAGNOSIS — E2740 Unspecified adrenocortical insufficiency: Secondary | ICD-10-CM

## 2018-02-04 DIAGNOSIS — E878 Other disorders of electrolyte and fluid balance, not elsewhere classified: Secondary | ICD-10-CM

## 2018-02-04 DIAGNOSIS — Z7982 Long term (current) use of aspirin: Secondary | ICD-10-CM

## 2018-02-04 DIAGNOSIS — N400 Enlarged prostate without lower urinary tract symptoms: Secondary | ICD-10-CM | POA: Diagnosis present

## 2018-02-04 DIAGNOSIS — E785 Hyperlipidemia, unspecified: Secondary | ICD-10-CM | POA: Diagnosis present

## 2018-02-04 DIAGNOSIS — I959 Hypotension, unspecified: Secondary | ICD-10-CM | POA: Diagnosis present

## 2018-02-04 DIAGNOSIS — N183 Chronic kidney disease, stage 3 unspecified: Secondary | ICD-10-CM

## 2018-02-04 DIAGNOSIS — Z888 Allergy status to other drugs, medicaments and biological substances status: Secondary | ICD-10-CM

## 2018-02-04 DIAGNOSIS — Z87891 Personal history of nicotine dependence: Secondary | ICD-10-CM

## 2018-02-04 DIAGNOSIS — D638 Anemia in other chronic diseases classified elsewhere: Secondary | ICD-10-CM | POA: Diagnosis present

## 2018-02-04 DIAGNOSIS — Z823 Family history of stroke: Secondary | ICD-10-CM

## 2018-02-04 DIAGNOSIS — R109 Unspecified abdominal pain: Secondary | ICD-10-CM

## 2018-02-04 DIAGNOSIS — E875 Hyperkalemia: Secondary | ICD-10-CM

## 2018-02-04 DIAGNOSIS — G8929 Other chronic pain: Secondary | ICD-10-CM

## 2018-02-04 DIAGNOSIS — Z807 Family history of other malignant neoplasms of lymphoid, hematopoietic and related tissues: Secondary | ICD-10-CM

## 2018-02-04 DIAGNOSIS — R824 Acetonuria: Secondary | ICD-10-CM | POA: Diagnosis present

## 2018-02-04 HISTORY — DX: Acute kidney failure, unspecified: N17.9

## 2018-02-04 LAB — COMPREHENSIVE METABOLIC PANEL
ALBUMIN: 4.6 g/dL (ref 3.5–5.0)
ALK PHOS: 38 U/L (ref 38–126)
ALT: 34 U/L (ref 0–44)
ANION GAP: 11 (ref 5–15)
AST: 26 U/L (ref 15–41)
BILIRUBIN TOTAL: 1 mg/dL (ref 0.3–1.2)
BUN: 51 mg/dL — ABNORMAL HIGH (ref 6–20)
CALCIUM: 10.3 mg/dL (ref 8.9–10.3)
CO2: 22 mmol/L (ref 22–32)
Chloride: 84 mmol/L — ABNORMAL LOW (ref 98–111)
Creatinine, Ser: 1.87 mg/dL — ABNORMAL HIGH (ref 0.61–1.24)
GFR calc non Af Amer: 38 mL/min — ABNORMAL LOW (ref >60)
GFR, EST AFRICAN AMERICAN: 44 mL/min — AB (ref >60)
GLUCOSE: 102 mg/dL — AB (ref 70–99)
Potassium: 5 mmol/L (ref 3.5–5.1)
Sodium: 117 mmol/L — CL (ref 135–145)
TOTAL PROTEIN: 7.6 g/dL (ref 6.5–8.1)

## 2018-02-04 LAB — URINALYSIS, ROUTINE W REFLEX MICROSCOPIC
Bilirubin Urine: NEGATIVE
GLUCOSE, UA: NEGATIVE mg/dL
Hgb urine dipstick: NEGATIVE
KETONES UR: 20 mg/dL — AB
LEUKOCYTES UA: NEGATIVE
NITRITE: NEGATIVE
PROTEIN: NEGATIVE mg/dL
Specific Gravity, Urine: 1.018 (ref 1.005–1.030)
pH: 5 (ref 5.0–8.0)

## 2018-02-04 LAB — CBC WITH DIFFERENTIAL/PLATELET
Abs Immature Granulocytes: 0.02 10*3/uL (ref 0.00–0.07)
BASOS ABS: 0 10*3/uL (ref 0.0–0.1)
Basophils Relative: 1 %
EOS ABS: 0.2 10*3/uL (ref 0.0–0.5)
EOS PCT: 4 %
HEMATOCRIT: 39.6 % (ref 39.0–52.0)
Hemoglobin: 14.6 g/dL (ref 13.0–17.0)
Immature Granulocytes: 0 %
LYMPHS ABS: 1.3 10*3/uL (ref 0.7–4.0)
Lymphocytes Relative: 24 %
MCH: 31.3 pg (ref 26.0–34.0)
MCHC: 36.9 g/dL — AB (ref 30.0–36.0)
MCV: 84.8 fL (ref 80.0–100.0)
Monocytes Absolute: 0.8 10*3/uL (ref 0.1–1.0)
Monocytes Relative: 14 %
NRBC: 0 % (ref 0.0–0.2)
Neutro Abs: 3.1 10*3/uL (ref 1.7–7.7)
Neutrophils Relative %: 57 %
Platelets: 197 10*3/uL (ref 150–400)
RBC: 4.67 MIL/uL (ref 4.22–5.81)
RDW: 12.6 % (ref 11.5–15.5)
WBC: 5.5 10*3/uL (ref 4.0–10.5)

## 2018-02-04 LAB — I-STAT TROPONIN, ED: Troponin i, poc: 0 ng/mL (ref 0.00–0.08)

## 2018-02-04 MED ORDER — SODIUM CHLORIDE 0.9 % IV BOLUS
1000.0000 mL | Freq: Once | INTRAVENOUS | Status: AC
  Administered 2018-02-04: 1000 mL via INTRAVENOUS

## 2018-02-04 NOTE — ED Notes (Signed)
ED TO INPATIENT HANDOFF REPORT  Name/Age/Gender Nicholas Coleman 57 y.o. male  Code Status   Home/SNF/Other Home  Chief Complaint low BP  Level of Care/Admitting Diagnosis ED Disposition    ED Disposition Condition [facility] Area: [place] [[id number]]  Level of Care: Telemetry [5]  Admit to tele based on following criteria: Other see comments  Comments: hypotension  Diagnosis: Hypotension [948546]  Admitting Physician: Shela Leff [[id number]]  Attending Physician: Shela Leff [[id number]]  PT Class (Do Not Modify): Observation [104]  PT Acc Code (Do Not Modify): Observation [10022]       Medical History Past Medical History:  Diagnosis Date  . Atypical chest pain 12/11/2017  . BPH (benign prostatic hyperplasia) 07/12/2015  . Essential hypertension 07/12/2015  . Exertional dyspnea 12/11/2017  . Hyperlipidemia   . Hypertension     Allergies Allergies  Allergen Reactions  . Hydrocodone-Acetaminophen Anxiety and Other (See Comments)    Insomnia, "jittery"  . Oxycodone Anxiety and Other (See Comments)    Insomnia, "jittery"    IV Location/Drains/Wounds Patient Lines/Drains/Airways Status   Active Line/Drains/Airways    Name:   Placement date:   Placement time:   Site:   Days:   Peripheral IV 02/04/18 Left Antecubital   02/04/18    2103    Antecubital   less than 1          Labs/Imaging Results for orders placed or performed during the hospital encounter of 02/04/18 (from the past 48 hour(s))  CBC with Differential     Status: Abnormal   Collection Time: 02/04/18  9:07 PM  Result Value Ref Range   WBC 5.5 4.0 - 10.5 K/uL   RBC 4.67 4.22 - 5.81 MIL/uL   Hemoglobin 14.6 13.0 - 17.0 g/dL   HCT 39.6 39.0 - 52.0 %   MCV 84.8 80.0 - 100.0 fL   MCH 31.3 26.0 - 34.0 pg   MCHC 36.9 (H) 30.0 - 36.0 g/dL   RDW 12.6 11.5 - 15.5 %   Platelets 197 150 - 400 K/uL   nRBC 0.0 0.0 - 0.2 %   Neutrophils Relative % 57 %    Neutro Abs 3.1 1.7 - 7.7 K/uL   Lymphocytes Relative 24 %   Lymphs Abs 1.3 0.7 - 4.0 K/uL   Monocytes Relative 14 %   Monocytes Absolute 0.8 0.1 - 1.0 K/uL   Eosinophils Relative 4 %   Eosinophils Absolute 0.2 0.0 - 0.5 K/uL   Basophils Relative 1 %   Basophils Absolute 0.0 0.0 - 0.1 K/uL   Immature Granulocytes 0 %   Abs Immature Granulocytes 0.02 0.00 - 0.07 K/uL    Comment: Performed at [facility], [place] [address] [place], [place] [address]  Comprehensive metabolic panel     Status: Abnormal   Collection Time: 02/04/18  9:07 PM  Result Value Ref Range   Sodium 117 (LL) 135 - 145 mmol/L    Comment: CRITICAL RESULT CALLED TO, READ BACK BY AND VERIFIED WITH: Geryl Councilman RN 2147 02/04/18 A NAVARRO    Potassium 5.0 3.5 - 5.1 mmol/L   Chloride 84 (L) 98 - 111 mmol/L   CO2 22 22 - 32 mmol/L   Glucose, Bld 102 (H) 70 - 99 mg/dL   BUN 51 (H) 6 - 20 mg/dL   Creatinine, Ser 1.87 (H) 0.61 - 1.24 mg/dL   Calcium 10.3 8.9 - 10.3 mg/dL   Total Protein 7.6 6.5 - 8.1  g/dL   Albumin 4.6 3.5 - 5.0 g/dL   AST 26 15 - 41 U/L   ALT 34 0 - 44 U/L   Alkaline Phosphatase 38 38 - 126 U/L   Total Bilirubin 1.0 0.3 - 1.2 mg/dL   GFR calc non Af Amer 38 (L) >60 mL/min   GFR calc Af Amer 44 (L) >60 mL/min    Comment: (NOTE) The eGFR has been calculated using the CKD EPI equation. This calculation has not been validated in all clinical situations. eGFR's persistently <60 mL/min signify possible Chronic Kidney Disease.    Anion gap 11 5 - 15    Comment: Performed at [facility], [place] [address], [place], [place] [address]  I-Stat Troponin, ED (not at [facility])     Status: None   Collection Time: 02/04/18  9:08 PM  Result Value Ref Range   Troponin i, poc 0.00 0.00 - 0.08 ng/mL   Comment 3            Comment: Due to the release kinetics of cTnI, a negative result within the first hours of the onset of symptoms does not rule out myocardial infarction with  certainty. If myocardial infarction is still suspected, repeat the test at appropriate intervals.   Urinalysis, Routine w reflex microscopic     Status: Abnormal   Collection Time: 02/04/18  9:20 PM  Result Value Ref Range   Color, Urine YELLOW YELLOW   APPearance CLEAR CLEAR   Specific Gravity, Urine 1.018 1.005 - 1.030   pH 5.0 5.0 - 8.0   Glucose, UA NEGATIVE NEGATIVE mg/dL   Hgb urine dipstick NEGATIVE NEGATIVE   Bilirubin Urine NEGATIVE NEGATIVE   Ketones, ur 20 (A) NEGATIVE mg/dL   Protein, ur NEGATIVE NEGATIVE mg/dL   Nitrite NEGATIVE NEGATIVE   Leukocytes, UA NEGATIVE NEGATIVE    Comment: Performed at [place] [address], [place], [place] [address]   Dg Chest 2 View  Result Date: 02/04/2018 CLINICAL DATA:  Initial evaluation for acute hypotension. EXAM: CHEST - 2 VIEW COMPARISON:  None available. FINDINGS: The cardiac and mediastinal silhouettes are within normal limits. Aortic atherosclerosis. The lungs are normally inflated. No airspace consolidation, pleural effusion, or pulmonary edema is identified. There is no pneumothorax. No acute osseous abnormality identified. IMPRESSION: No active cardiopulmonary disease. Electronically Signed   By: Jeannine Boga M.D.   On: 02/04/2018 21:17    Pending Labs Unresulted Labs (From admission, onward)    Start     Ordered   02/04/18 4287  Basic metabolic panel  ONCE - STAT,   R     02/04/18 2307          Vitals/Pain Today's Vitals   02/04/18 2130 02/04/18 2200 02/04/18 2230 02/04/18 2303  BP: 92/68 107/64 101/63 100/60  Pulse: 77 79 82 76  Resp: 13 19 17  (!) 22  Temp:      TempSrc:      SpO2: 98% 100% 100% 98%  Weight:      Height:      PainSc:        Isolation Precautions No active isolations  Medications Medications  sodium chloride 0.9 % bolus 1,000 mL (0 mLs Intravenous Stopped 02/04/18 2328)    Mobility walks

## 2018-02-04 NOTE — ED Notes (Signed)
Date and time results received: 02/04/18 9:48 PM   Test: Sodium Critical Value: 117   Name of Provider Notified: Courtni, PA  Orders Received? Or Actions Taken?:

## 2018-02-04 NOTE — ED Notes (Signed)
Pt aware that urine sample is needed.  

## 2018-02-04 NOTE — ED Notes (Signed)
Urine and culture sent to lab  

## 2018-02-04 NOTE — ED Notes (Signed)
Mr Chrissie NoaWilliam got weak during the ortho V/S could not finish last standing set  RN was informed

## 2018-02-04 NOTE — ED Provider Notes (Signed)
[facility]-EMERGENCY DEPT Provider Note   CSN: [id number] Arrival date & time: 02/04/18  1931     History   Chief Complaint Chief Complaint  Patient presents with  . Hypotension    HPI Nicholas Coleman is a 57 y.o. male.  HPI  Pt is a 57 y/o male with a h/o HTN, HLD, who presents to the ED today c/o hypotension that began 1 week ago. Has been taking BP at home and it has been in the 70s systolic. Pt is also c/o nausea, lightheadedness with ambulation, near syncope, and DOE. Denies fevers, vomiting, diarrhea, hematochezia, urinary sxs, chest pain.  He denies any current abdominal pain.  He states that he has had issues with abdominal pain usually in the afternoons.  This pain is associated with nausea.  He denies any of the symptoms currently.  He stopped taking his BP medications 5 days ago.  Pt was recently on a 10 day detox the he finished yesterday. States he had protein shake in the AM, protein (4-6oz) and salad for lunch, and protein and salad for dinner.   [facility] and had a BP of 79/53. He was sent here for eval. States he had [employer] prior to arrival.   Past Medical History:  Diagnosis Date  . Atypical chest pain 12/11/2017  . BPH (benign prostatic hyperplasia) 07/12/2015  . Essential hypertension 07/12/2015  . Exertional dyspnea 12/11/2017  . Hyperlipidemia   . Hypertension     Patient Active Problem List   Diagnosis Date Noted  . Hypotension 02/04/2018  . Atypical chest pain 12/11/2017  . Exertional dyspnea 12/11/2017  . Essential hypertension 07/12/2015  . Hyperlipidemia 07/12/2015  . BPH (benign prostatic hyperplasia) 07/12/2015    History reviewed. No pertinent surgical history.      Home Medications    Prior to Admission medications   Medication Sig Start Date End Date Taking? Authorizing Provider  alfuzosin (UROXATRAL) 10 MG 24 hr tablet Take 10 mg by mouth at bedtime.  11/03/17  Yes [provider]    aspirin 500 MG EC tablet Take 1,000 mg by mouth every 6 (six) hours as needed for pain.   Yes [provider]  hydrochlorothiazide (HYDRODIURIL) 25 MG tablet Take 12.5 mg by mouth daily.  05/31/15  Yes [provider]  Multiple Vitamin (MULTIVITAMIN) capsule Take 1 capsule by mouth daily.   Yes [provider]  Omega-3 Fatty Acids (FISH OIL PO) Take 1 capsule by mouth daily.    Yes [provider]  oxyCODONE (OXY IR/ROXICODONE) 5 MG immediate release tablet Take 5 mg by mouth every 4 (four) hours as needed for severe pain.   Yes [provider]  simvastatin (ZOCOR) 40 MG tablet Take 40 mg by mouth at bedtime.  01/05/16  Yes [provider]    Family History Family History  Problem Relation Age of Onset  . Lymphoma Mother   . Stroke Father   . CAD Father     Social History Social History   Tobacco Use  . Smoking status: Former Games developermoker  . Smokeless tobacco: Never Used  Substance Use Topics  . Alcohol use: Not on file  . Drug use: Not on file     Allergies   Hydrocodone-acetaminophen and Oxycodone   Review of Systems Review of Systems  Constitutional: Positive for appetite change. Negative for chills and fever.  HENT: Negative for congestion.   Eyes: Negative for visual disturbance.  Respiratory: Negative  for cough and shortness of breath.   Cardiovascular: Negative for chest pain.  Gastrointestinal: Positive for abdominal pain (resolved) and nausea (resolved). Negative for constipation, diarrhea and vomiting.  Genitourinary: Negative for dysuria and frequency.  Musculoskeletal: Negative for back pain and neck pain.  Skin: Negative for rash.  Neurological: Positive for light-headedness. Negative for headaches.   Physical Exam Updated Vital Signs BP 100/60   Pulse 76   Temp (!) 97.4 F (36.3 C) (Oral)   Resp (!) 22   Ht 6' (1.829 m)   Wt 96.6 kg   SpO2 98%   BMI 28.89 kg/m   Physical Exam  Constitutional: He  appears well-developed and well-nourished.  HENT:  Head: Normocephalic and atraumatic.  Eyes: Conjunctivae are normal.  Neck: Neck supple.  Cardiovascular: Normal rate, regular rhythm and normal heart sounds.  No murmur heard. Pulmonary/Chest: Effort normal and breath sounds normal. No stridor. No respiratory distress. He has no wheezes.  Abdominal: Soft. Bowel sounds are normal. He exhibits no distension. There is no tenderness. There is no guarding.  Musculoskeletal: He exhibits no edema.  Neurological: He is alert.  Mental Status:  Alert, thought content appropriate, able to give a coherent history. Speech fluent without evidence of aphasia. Able to follow 2 step commands without difficulty.  Cranial Nerves:  II:  pupils equal, round, reactive to light III,IV, VI: ptosis not present, extra-ocular motions intact bilaterally  V,VII: smile symmetric, facial light touch sensation equal VIII: hearing grossly normal to voice  X: uvula elevates symmetrically  XI: bilateral shoulder shrug symmetric and strong XII: midline tongue extension without fassiculations Motor:  Normal tone. 5/5 strength of BUE and BLE major muscle groups including strong and equal grip strength and dorsiflexion/plantar flexion Sensory: light touch normal in all extremities.  Skin: Skin is warm and dry.  Psychiatric: He has a normal mood and affect.  Nursing note and vitals reviewed.  ED Treatments / Results  Labs (all labs ordered are listed, but only abnormal results are displayed) Labs Reviewed  CBC WITH DIFFERENTIAL/PLATELET - Abnormal; Notable for the following components:      Result Value   MCHC 36.9 (*)    All other components within normal limits  COMPREHENSIVE METABOLIC PANEL - Abnormal; Notable for the following components:   Sodium 117 (*)    Chloride 84 (*)    Glucose, Bld 102 (*)    BUN 51 (*)    Creatinine, Ser 1.87 (*)    GFR calc non Af Amer 38 (*)    GFR calc Af Amer 44 (*)    All other  components within normal limits  URINALYSIS, ROUTINE W REFLEX MICROSCOPIC - Abnormal; Notable for the following components:   Ketones, ur 20 (*)    All other components within normal limits  BASIC METABOLIC PANEL  I-STAT TROPONIN, ED    EKG EKG Interpretation  Date/Time:  Monday February 04 2018 20:03:39 EST Ventricular Rate:  87 PR Interval:    QRS Duration: 90 QT Interval:  381 QTC Calculation: 459 R Axis:   107 Text Interpretation:  Sinus rhythm Low voltage with right axis deviation No old tracing to compare Confirmed by Rolan Bucco [phone]) on 02/04/2018 9:03:14 PM   Radiology Dg Chest 2 View  Result Date: 02/04/2018 CLINICAL DATA:  Initial evaluation for acute hypotension. EXAM: CHEST - 2 VIEW COMPARISON:  None available. FINDINGS: The cardiac and mediastinal silhouettes are within normal limits. Aortic atherosclerosis. The lungs are normally inflated. No airspace consolidation, pleural effusion,  or pulmonary edema is identified. There is no pneumothorax. No acute osseous abnormality identified. IMPRESSION: No active cardiopulmonary disease. Electronically Signed   By: Rise Mu M.D.   On: 02/04/2018 21:17    Procedures Procedures (including critical care time)  Medications Ordered in ED Medications  sodium chloride 0.9 % bolus 1,000 mL (0 mLs Intravenous Stopped 02/04/18 2328)     Initial Impression / Assessment and Plan / ED Course  I have reviewed the triage vital signs and the nursing notes.  Pertinent labs & imaging results that were available during my care of the patient were reviewed by me and considered in my medical decision making (see chart for details).     Final Clinical Impressions(s) / ED Diagnoses   Final diagnoses:  Hyponatremia   Patient presenting to the ED for evaluation of hypotension and positional lightheadedness.  On arrival to the ED blood pressure is in the 70s systolic.  His vital signs are otherwise stable.  Patient  recently completed a 10-day detox diet that he finished yesterday.  5 days ago began to have hypotension and stopped his blood pressure medications.  On evaluation exit exam is very reassuring including a normal cardiac, pulmonary and neurologic exam.  CBC is within normal limits, CMP shows a sodium of 117, elevated BUN/creatinine at 51/1.87.  Troponin is negative.  UA with ketonuria but otherwise normal.  Chest x-ray is negative.  EKG with normal sinus rhythm. Low voltage with right axis deviation.  Patient given 1 L of fluids in the ED.  Given his hyponatremia and AKI, will plan for admission for further treatment.  Case discussed with Dr. Loney Loh who accepts the patient for admission.  ED Discharge Orders    None       Rayne Du 02/04/18 2339    Rolan Bucco, MD 02/04/18 [phone]

## 2018-02-04 NOTE — ED Triage Notes (Signed)
[facility] for hypotension after being on detox diet for 10 days. Pt reports dizziness for the last week along with nausea. Pt reports stop taking HTN medications one week ago.

## 2018-02-05 ENCOUNTER — Encounter (HOSPITAL_COMMUNITY): Payer: Self-pay | Admitting: Internal Medicine

## 2018-02-05 DIAGNOSIS — N400 Enlarged prostate without lower urinary tract symptoms: Secondary | ICD-10-CM | POA: Diagnosis present

## 2018-02-05 DIAGNOSIS — E871 Hypo-osmolality and hyponatremia: Secondary | ICD-10-CM

## 2018-02-05 DIAGNOSIS — Z807 Family history of other malignant neoplasms of lymphoid, hematopoietic and related tissues: Secondary | ICD-10-CM | POA: Diagnosis not present

## 2018-02-05 DIAGNOSIS — E2740 Unspecified adrenocortical insufficiency: Secondary | ICD-10-CM

## 2018-02-05 DIAGNOSIS — E875 Hyperkalemia: Secondary | ICD-10-CM | POA: Diagnosis present

## 2018-02-05 DIAGNOSIS — I951 Orthostatic hypotension: Secondary | ICD-10-CM | POA: Diagnosis present

## 2018-02-05 DIAGNOSIS — R109 Unspecified abdominal pain: Secondary | ICD-10-CM

## 2018-02-05 DIAGNOSIS — Z7982 Long term (current) use of aspirin: Secondary | ICD-10-CM | POA: Diagnosis not present

## 2018-02-05 DIAGNOSIS — Z888 Allergy status to other drugs, medicaments and biological substances status: Secondary | ICD-10-CM | POA: Diagnosis not present

## 2018-02-05 DIAGNOSIS — E785 Hyperlipidemia, unspecified: Secondary | ICD-10-CM | POA: Diagnosis present

## 2018-02-05 DIAGNOSIS — E869 Volume depletion, unspecified: Secondary | ICD-10-CM

## 2018-02-05 DIAGNOSIS — D638 Anemia in other chronic diseases classified elsewhere: Secondary | ICD-10-CM | POA: Diagnosis present

## 2018-02-05 DIAGNOSIS — E878 Other disorders of electrolyte and fluid balance, not elsewhere classified: Secondary | ICD-10-CM

## 2018-02-05 DIAGNOSIS — N179 Acute kidney failure, unspecified: Secondary | ICD-10-CM

## 2018-02-05 DIAGNOSIS — I129 Hypertensive chronic kidney disease with stage 1 through stage 4 chronic kidney disease, or unspecified chronic kidney disease: Secondary | ICD-10-CM | POA: Diagnosis present

## 2018-02-05 DIAGNOSIS — E860 Dehydration: Secondary | ICD-10-CM | POA: Diagnosis present

## 2018-02-05 DIAGNOSIS — Z823 Family history of stroke: Secondary | ICD-10-CM | POA: Diagnosis not present

## 2018-02-05 DIAGNOSIS — R824 Acetonuria: Secondary | ICD-10-CM | POA: Diagnosis present

## 2018-02-05 DIAGNOSIS — N183 Chronic kidney disease, stage 3 (moderate): Secondary | ICD-10-CM | POA: Diagnosis present

## 2018-02-05 DIAGNOSIS — G8929 Other chronic pain: Secondary | ICD-10-CM

## 2018-02-05 DIAGNOSIS — Z8249 Family history of ischemic heart disease and other diseases of the circulatory system: Secondary | ICD-10-CM | POA: Diagnosis not present

## 2018-02-05 DIAGNOSIS — Z87891 Personal history of nicotine dependence: Secondary | ICD-10-CM | POA: Diagnosis not present

## 2018-02-05 DIAGNOSIS — R1084 Generalized abdominal pain: Secondary | ICD-10-CM | POA: Diagnosis present

## 2018-02-05 DIAGNOSIS — I959 Hypotension, unspecified: Secondary | ICD-10-CM | POA: Diagnosis not present

## 2018-02-05 HISTORY — DX: Acute kidney failure, unspecified: N17.9

## 2018-02-05 LAB — BASIC METABOLIC PANEL
ANION GAP: 8 (ref 5–15)
ANION GAP: 9 (ref 5–15)
ANION GAP: 9 (ref 5–15)
Anion gap: 8 (ref 5–15)
BUN: 38 mg/dL — AB (ref 6–20)
BUN: 42 mg/dL — AB (ref 6–20)
BUN: 48 mg/dL — AB (ref 6–20)
BUN: 48 mg/dL — AB (ref 6–20)
CALCIUM: 8.6 mg/dL — AB (ref 8.9–10.3)
CALCIUM: 9 mg/dL (ref 8.9–10.3)
CALCIUM: 9 mg/dL (ref 8.9–10.3)
CALCIUM: 8.8 mg/dL — AB (ref 8.9–10.3)
CO2: 20 mmol/L — AB (ref 22–32)
CO2: 21 mmol/L — ABNORMAL LOW (ref 22–32)
CO2: 21 mmol/L — ABNORMAL LOW (ref 22–32)
CO2: 22 mmol/L (ref 22–32)
CREATININE: 1.54 mg/dL — AB (ref 0.61–1.24)
CREATININE: 1.71 mg/dL — AB (ref 0.61–1.24)
CREATININE: 1.69 mg/dL — AB (ref 0.61–1.24)
Chloride: 90 mmol/L — ABNORMAL LOW (ref 98–111)
Chloride: 91 mmol/L — ABNORMAL LOW (ref 98–111)
Chloride: 89 mmol/L — ABNORMAL LOW (ref 98–111)
Chloride: 92 mmol/L — ABNORMAL LOW (ref 98–111)
Creatinine, Ser: 1.58 mg/dL — ABNORMAL HIGH (ref 0.61–1.24)
GFR calc Af Amer: 49 mL/min — ABNORMAL LOW (ref >60)
GFR calc Af Amer: 49 mL/min — ABNORMAL LOW (ref >60)
GFR calc Af Amer: 56 mL/min — ABNORMAL LOW (ref >60)
GFR calc Af Amer: 55 mL/min — ABNORMAL LOW (ref >60)
GFR calc non Af Amer: 43 mL/min — ABNORMAL LOW (ref >60)
GFR calc non Af Amer: 42 mL/min — ABNORMAL LOW (ref >60)
GFR calc non Af Amer: 49 mL/min — ABNORMAL LOW (ref >60)
GFR, EST NON AFRICAN AMERICAN: 47 mL/min — AB (ref >60)
GLUCOSE: 113 mg/dL — AB (ref 70–99)
GLUCOSE: 125 mg/dL — AB (ref 70–99)
GLUCOSE: 134 mg/dL — AB (ref 70–99)
GLUCOSE: 139 mg/dL — AB (ref 70–99)
Potassium: 4.7 mmol/L (ref 3.5–5.1)
Potassium: 5.3 mmol/L — ABNORMAL HIGH (ref 3.5–5.1)
Potassium: 5.3 mmol/L — ABNORMAL HIGH (ref 3.5–5.1)
Potassium: 5 mmol/L (ref 3.5–5.1)
Sodium: 120 mmol/L — ABNORMAL LOW (ref 135–145)
Sodium: 120 mmol/L — ABNORMAL LOW (ref 135–145)
Sodium: 119 mmol/L — CL (ref 135–145)
Sodium: 121 mmol/L — ABNORMAL LOW (ref 135–145)

## 2018-02-05 LAB — OSMOLALITY
OSMOLALITY: 263 mosm/kg — AB (ref 275–295)
Osmolality: 267 mOsm/kg — ABNORMAL LOW (ref 275–295)

## 2018-02-05 LAB — HIV ANTIBODY (ROUTINE TESTING W REFLEX): HIV SCREEN 4TH GENERATION: NONREACTIVE

## 2018-02-05 LAB — HEPATIC FUNCTION PANEL
ALK PHOS: 31 U/L — AB (ref 38–126)
ALT: 28 U/L (ref 0–44)
AST: 22 U/L (ref 15–41)
Albumin: 3.4 g/dL — ABNORMAL LOW (ref 3.5–5.0)
BILIRUBIN DIRECT: 0.1 mg/dL (ref 0.0–0.2)
Indirect Bilirubin: 0.5 mg/dL (ref 0.3–0.9)
Total Bilirubin: 0.6 mg/dL (ref 0.3–1.2)
Total Protein: 5.8 g/dL — ABNORMAL LOW (ref 6.5–8.1)

## 2018-02-05 LAB — LIPASE, BLOOD: Lipase: 46 U/L (ref 11–51)

## 2018-02-05 LAB — SODIUM, URINE, RANDOM
Sodium, Ur: 59 mmol/L
Sodium, Ur: 33 mmol/L

## 2018-02-05 LAB — PHOSPHORUS: PHOSPHORUS: 4.6 mg/dL (ref 2.5–4.6)

## 2018-02-05 LAB — TSH: TSH: 1.818 u[IU]/mL (ref 0.350–4.500)

## 2018-02-05 LAB — OSMOLALITY, URINE
Osmolality, Ur: 440 mOsm/kg (ref 300–900)
Osmolality, Ur: 242 mOsm/kg — ABNORMAL LOW (ref 300–900)

## 2018-02-05 LAB — CORTISOL: Cortisol, Plasma: 3.7 ug/dL

## 2018-02-05 LAB — MAGNESIUM: Magnesium: 1.6 mg/dL — ABNORMAL LOW (ref 1.7–2.4)

## 2018-02-05 MED ORDER — ALFUZOSIN HCL ER 10 MG PO TB24: 10.0000 mg | ORAL_TABLET | Freq: Every day | ORAL | Status: DC

## 2018-02-05 MED ORDER — MAGNESIUM SULFATE 2 GM/50ML IV SOLN
2.0000 g | Freq: Once | INTRAVENOUS | Status: AC
  Administered 2018-02-05: 2 g via INTRAVENOUS
  Filled 2018-02-05: qty 50

## 2018-02-05 MED ORDER — ACETAMINOPHEN 500 MG PO TABS
1000.0000 mg | ORAL_TABLET | Freq: Four times a day (QID) | ORAL | Status: DC | PRN
  Administered 2018-02-05: 1000 mg via ORAL
  Filled 2018-02-05 (×2): qty 2

## 2018-02-05 MED ORDER — OMEGA-3-ACID ETHYL ESTERS 1 G PO CAPS
1.0000 g | ORAL_CAPSULE | Freq: Every day | ORAL | Status: DC
  Administered 2018-02-05 – 2018-02-07 (×3): 1 g via ORAL
  Filled 2018-02-05 (×3): qty 1

## 2018-02-05 MED ORDER — ONDANSETRON HCL 4 MG/2ML IJ SOLN
4.0000 mg | Freq: Four times a day (QID) | INTRAMUSCULAR | Status: DC | PRN
  Administered 2018-02-05 (×2): 4 mg via INTRAVENOUS
  Filled 2018-02-05 (×3): qty 2

## 2018-02-05 MED ORDER — ENOXAPARIN SODIUM 40 MG/0.4ML ~~LOC~~ SOLN
40.0000 mg | Freq: Every day | SUBCUTANEOUS | Status: DC
  Administered 2018-02-05 – 2018-02-07 (×3): 40 mg via SUBCUTANEOUS
  Filled 2018-02-05 (×3): qty 0.4

## 2018-02-05 MED ORDER — SODIUM CHLORIDE 0.9 % IV SOLN
INTRAVENOUS | Status: DC
  Administered 2018-02-05: 03:00:00 via INTRAVENOUS

## 2018-02-05 MED ORDER — SIMVASTATIN 40 MG PO TABS
40.0000 mg | ORAL_TABLET | Freq: Every day | ORAL | Status: DC
  Administered 2018-02-05 – 2018-02-06 (×2): 40 mg via ORAL
  Filled 2018-02-05 (×2): qty 1

## 2018-02-05 MED ORDER — ADULT MULTIVITAMIN W/MINERALS CH
1.0000 | ORAL_TABLET | Freq: Every day | ORAL | Status: DC
  Administered 2018-02-05 – 2018-02-07 (×3): 1 via ORAL
  Filled 2018-02-05 (×3): qty 1

## 2018-02-05 MED ORDER — PHENOL 1.4 % MT LIQD
1.0000 | OROMUCOSAL | Status: DC | PRN
  Filled 2018-02-05: qty 177

## 2018-02-05 NOTE — H&P (Signed)
History and Physical    Nicholas PostinWilliam A Dietrick [id number] DOB: September 18, 1960 DOA: 02/04/2018  PCP: Wilfrid LundBecker, Anna G, PA Patient coming from: Urgent care  Chief Complaint: Hypotension  HPI: Nicholas Coleman is a 57 y.o. male with medical history significant of hypertension, hyperlipidemia, BPH presenting to the hospital from urgent care for hypotension after being on a detox diet for 10 days.  Patient states he has been on a detox diet for the past 10 days which requires you to eat a lot of protein, vitamins, and minerals.  No carbohydrate or sugars are allowed.  States on the fifth day of the diet he started experiencing generalized abdominal pain and restlessness of his legs.  He also felt dizzy and nauseous.  He had one episode of vomiting a few days ago.  He has not had any diarrhea.  He has not been eating or drinking much since then.  States on day 4 of the diet he had noticed that his blood pressure was low and he stopped taking his home blood pressure medications.  Does state that his abdominal pain is chronic since April and he has been seen by GI (Dr. Loreta AveMann) in the past and extensive work-up has been negative.  States his abdominal pain was recently under control until he started this new diet.  ED Course: Blood pressure 79/53 on arrival.  Afebrile, not tachycardic, not tachypneic, and satting well on room air.  No leukocytosis.  Sodium 117, chloride 84, BUN 51, creatinine 1.8.  No recent labs, creatinine was 1.1 two years ago.  I-STAT troponin negative.  UA with ketones but not suggestive of infection.  Chest x-ray showing no active cardiopulmonary disease.  Patient received 1 L normal saline bolus in the ED.  Review of Systems: As per HPI otherwise 10 point review of systems negative.  Past Medical History:  Diagnosis Date  . AKI (acute kidney injury) (HCC) 02/05/2018  . Atypical chest pain 12/11/2017  . BPH (benign prostatic hyperplasia) 07/12/2015  . Essential hypertension 07/12/2015  .  Exertional dyspnea 12/11/2017  . Hyperlipidemia   . Hypertension     History reviewed. No pertinent surgical history.   reports that he has quit smoking. He has never used smokeless tobacco. His alcohol and drug histories are not on file.  Allergies  Allergen Reactions  . Hydrocodone-Acetaminophen Anxiety and Other (See Comments)    Insomnia, "jittery"  . Oxycodone Anxiety and Other (See Comments)    Insomnia, "jittery"    Family History  Problem Relation Age of Onset  . Lymphoma Mother   . Stroke Father   . CAD Father     Prior to Admission medications   Medication Sig Start Date End Date Taking? Authorizing Provider  alfuzosin (UROXATRAL) 10 MG 24 hr tablet Take 10 mg by mouth at bedtime.  11/03/17  Yes [provider]  aspirin 500 MG EC tablet Take 1,000 mg by mouth every 6 (six) hours as needed for pain.   Yes [provider]  hydrochlorothiazide (HYDRODIURIL) 25 MG tablet Take 12.5 mg by mouth daily.  05/31/15  Yes [provider]  Multiple Vitamin (MULTIVITAMIN) capsule Take 1 capsule by mouth daily.   Yes [provider]  Omega-3 Fatty Acids (FISH OIL PO) Take 1 capsule by mouth daily.    Yes [provider]  oxyCODONE (OXY IR/ROXICODONE) 5 MG immediate release tablet Take 5 mg by mouth every 4 (four) hours as needed for severe pain.   Yes [provider]  simvastatin (ZOCOR) 40 MG tablet Take 40 mg by mouth at bedtime.  01/05/16  Yes [provider]    Physical Exam: Vitals:   02/04/18 2303 02/05/18 0000 02/05/18 0024 02/05/18 0026  BP: 100/60 (!) 94/56  (!) 119/55  Pulse: 76 79  81  Resp: (!) 22 12  16   Temp:    98.2 F (36.8 C)  TempSrc:    Oral  SpO2: 98% 98%  100%  Weight:   100.2 kg   Height:   6' (1.829 m)     Physical Exam  Constitutional: He is oriented to person, place, and time. He appears well-developed and well-nourished. No distress.  HENT:  Head: Normocephalic.  Eyes: Right eye  exhibits no discharge. Left eye exhibits no discharge.  Neck: Neck supple.  Cardiovascular: Normal rate, regular rhythm and intact distal pulses.  Pulmonary/Chest: Effort normal and breath sounds normal. No respiratory distress. He has no wheezes. He has no rales.  Abdominal: Soft. Bowel sounds are normal. He exhibits no distension. There is no tenderness. There is no guarding.  Musculoskeletal: He exhibits no edema.  Neurological: He is alert and oriented to person, place, and time.  Skin: Skin is warm and dry. He is not diaphoretic.  Psychiatric: He has a normal mood and affect. His behavior is normal.     Labs on Admission: I have personally reviewed following labs and imaging studies  CBC: Recent Labs  Lab 02/04/18 2107  WBC 5.5  NEUTROABS 3.1  HGB 14.6  HCT 39.6  MCV 84.8  PLT 197   Basic Metabolic Panel: Recent Labs  Lab 02/04/18 2107 02/05/18 0040  NA 117* 120*  K 5.0 4.7  CL 84* 90*  CO2 22 22  GLUCOSE 102* 139*  BUN 51* 48*  CREATININE 1.87* 1.69*  CALCIUM 10.3 8.8*   GFR: Estimated Creatinine Clearance: 57.6 mL/min (A) (by C-G formula based on SCr of 1.69 mg/dL (H)). Liver Function Tests: Recent Labs  Lab 02/04/18 2107  AST 26  ALT 34  ALKPHOS 38  BILITOT 1.0  PROT 7.6  ALBUMIN 4.6   No results for input(s): LIPASE, AMYLASE in the last 168 hours. No results for input(s): AMMONIA in the last 168 hours. Coagulation Profile: No results for input(s): INR, PROTIME in the last 168 hours. Cardiac Enzymes: No results for input(s): CKTOTAL, CKMB, CKMBINDEX, TROPONINI in the last 168 hours. BNP (last 3 results) No results for input(s): PROBNP in the last 8760 hours. HbA1C: No results for input(s): HGBA1C in the last 72 hours. CBG: No results for input(s): GLUCAP in the last 168 hours. Lipid Profile: No results for input(s): CHOL, HDL, LDLCALC, TRIG, CHOLHDL, LDLDIRECT in the last 72 hours. Thyroid Function Tests: No results for input(s): TSH,  T4TOTAL, FREET4, T3FREE, THYROIDAB in the last 72 hours. Anemia Panel: No results for input(s): VITAMINB12, FOLATE, FERRITIN, TIBC, IRON, RETICCTPCT in the last 72 hours. Urine analysis:    Component Value Date/Time   COLORURINE YELLOW 02/04/2018 2120   APPEARANCEUR CLEAR 02/04/2018 2120   LABSPEC 1.018 02/04/2018 2120   PHURINE 5.0 02/04/2018 2120   GLUCOSEU NEGATIVE 02/04/2018 2120   HGBUR NEGATIVE 02/04/2018 2120   BILIRUBINUR NEGATIVE 02/04/2018 2120   KETONESUR 20 (A) 02/04/2018 2120   PROTEINUR NEGATIVE 02/04/2018 2120   NITRITE NEGATIVE 02/04/2018 2120   LEUKOCYTESUR NEGATIVE 02/04/2018 2120    Radiological Exams on Admission: Dg Chest 2 View  Result Date: 02/04/2018 CLINICAL DATA:  Initial evaluation for acute hypotension. EXAM: CHEST - 2  VIEW COMPARISON:  None available. FINDINGS: The cardiac and mediastinal silhouettes are within normal limits. Aortic atherosclerosis. The lungs are normally inflated. No airspace consolidation, pleural effusion, or pulmonary edema is identified. There is no pneumothorax. No acute osseous abnormality identified. IMPRESSION: No active cardiopulmonary disease. Electronically Signed   By: Rise Mu M.D.   On: 02/04/2018 21:17    EKG: Independently reviewed.  Sinus rhythm (heart rate 87).   Assessment/Plan Principal Problem:   Hypotension Active Problems:   Hyponatremia   Hypochloremia   AKI (acute kidney injury) (HCC)   Chronic abdominal pain   Hypotension -In the setting of decreased p.o. intake from 10-day detox diet. -He was hypotensive on arrival to the ED.  Currently blood pressure improved after receiving a bolus of of 1 L normal saline in the ED. -Continue IV fluid hydration -Continue to monitor blood pressure -Hold home hydrochlorothiazide, alfuzosin  Severe electrolyte derangements including hyponatremia and hypochloremia -Sodium 117 and chloride 84.  Patient received 1 L normal saline bolus in the ED.  Repeat  labs showing sodium 120 and chloride 90.  Patient has no neurologic manifestations from hyponatremia.  In the setting of decreased p.o. intake from 10-day detox diet.  UA with ketones.  Hyponatremia likely due to decreased p.o. intake and volume depletion as patient was hypotensive on arrival. -Gentle IV fluid hydration with normal saline -BMP every 4 hours.  Closely monitor sodium level to avoid overcorrection. -Check serum osmolarity -Check urine osmolarity, urine sodium -Monitor and replete other electrolytes including potassium, magnesium, calcium, phosphorus  AKI Likely prerenal due to dehydration.  BUN 51 and creatinine 1.8. No recent labs, creatinine was 1.1 two years ago. Patient received 1 L fluid bolus in the ED.  Repeat labs showing BUN 48, creatinine 1.6. -Continue IV fluid hydration -Avoid nephrotoxic agents/contrast -Continue to monitor BMP  Chronic abdominal pain Patient reports having chronic generalized abdominal pain since April.  States he is followed by Dr. Loreta Ave from GI.  Abdominal ultrasound done in June 2019 showing early mild fatty infiltration of the liver; no acute findings.  HIDA scan done in June 2019 was normal.  CT abdomen pelvis with contrast done in September 2019 showing no acute abnormality.  At present, abdominal exam benign.  Recurrence of abdominal pain likely due to significant recent dietary changes. -Hepatic function panel -Check lipase level -Tylenol PRN pain -Repeat abdominal imaging if labs abnormal -Zofran PRN nausea -Outpatient GI follow-up  DVT prophylaxis: Lovenox Code Status: Patient wishes to be full code Family Communication: No family available Disposition Plan: Anticipate discharge to home in 1 to 2 days Consults called: None Admission status: Observation   John Giovanni MD [facility] Pager [phone]  If 7PM-7AM, please contact night-coverage [email or web address] Password [facility]  02/05/2018, 2:53 AM

## 2018-02-05 NOTE — Progress Notes (Signed)
PROGRESS NOTE    Nicholas Coleman  QVZ:[id number] DOB: 29-Apr-1960 DOA: 02/04/2018 PCP: Wilfrid Lund, PA  Outpatient Specialists:   Brief Narrative:  As per H and P - "Nicholas Coleman is a 57 y.o. male with medical history significant of hypertension, hyperlipidemia, BPH presenting to the hospital from urgent care for hypotension after being on a detox diet for 10 days.  Patient states he has been on a detox diet for the past 10 days which requires you to eat a lot of protein, vitamins, and minerals.  No carbohydrate or sugars are allowed.  States on the fifth day of the diet he started experiencing generalized abdominal pain and restlessness of his legs.  He also felt dizzy and nauseous.  He had one episode of vomiting a few days ago.  He has not had any diarrhea.  He has not been eating or drinking much since then.  States on day 4 of the diet he had noticed that his blood pressure was low and he stopped taking his home blood pressure medications.  Does state that his abdominal pain is chronic since April and he has been seen by GI (Dr. Loreta Ave) in the past and extensive work-up has been negative.  States his abdominal pain was recently under control until he started this new diet.  ED Course: Blood pressure 79/53 on arrival.  Afebrile, not tachycardic, not tachypneic, and satting well on room air.  No leukocytosis.  Sodium 117, chloride 84, BUN 51, creatinine 1.8.  No recent labs, creatinine was 1.1 two years ago.  I-STAT troponin negative.  UA with ketones but not suggestive of infection.  Chest x-ray showing no active cardiopulmonary disease.  Patient received 1 L normal saline bolus in the ED".  02/05/2018: Patient's clinical syndrome is very suggestive of adrenal insufficiency (low sodium, high potassium, hypotension likely worsened by volume depletion, and chronic abdominal pain.  Low cortisol).  Cortisol level is 3.7.  We will proceed with ACTH stimulation test.  Hyponatremia is likely  multifactorial, including possible adrenal insufficiency, volume depletion and query appropriate versus inappropriate secretion of ADH.  Initial work-up revealed urine sodium of 59 and urine osmolality of 440 and plasma osmolality of 263.  Repeat work-up after hydration revealed urine sodium of 33, Urine osmolality of 242 and plasma osmolality of 267.  AKI is also likely multifactorial, including prerenal versus combined prerenal and ATN from hypotension (rate of renal recovery will likely suggest possible etiology of AKI).  Please follow results of above work-up, and consider appropriate treatment.  Have a low threshold to call necessary consults if deemed needed.  Meanwhile, continue to monitor sodium level closely to avoid rapid correction.  Assessment & Plan:   Principal Problem:   Hypotension Active Problems:   Hyponatremia   Hypochloremia   AKI (acute kidney injury) (HCC)   Chronic abdominal pain   Likely adrenal insufficiency:  Kindly see above documentation.   Follow results of work-up documented above.   Have a low threshold to call consults if needed.    Hyponatremia: Also see above. Follow work-up as documented above.  Hypotension: Likely multifactorial. Possible rule out volume depletion and adrenal insufficiency. Continue to monitor closely.  AKI:  Likely multifactorial. See above. Seems to be slowly resolving. Further management will depend on hospital course. Avoid hypotension. Keep map greater than 65 mmHg Avoid nephrotoxic's. Dose of medications assuming GFR of less than 15 mils per minute.  Chronic abdominal pain: Probably related to adrenal insufficiency. Continue to  monitor, and work-up as deemed necessary.  DVT prophylaxis: Lovenox Code Status:  Full code.   Family Communication: No family available Disposition Plan:  Patient will be discharged back home eventually.   Consults called: None  Procedures:   None  Antimicrobials:    None  Subjective: No new complaints. No chest pain No shortness of breath Seems eager to be discharged back home.  Objective: Vitals:   02/05/18 0024 02/05/18 0026 02/05/18 0408 02/05/18 0845  BP:  (!) 119/55 91/77 90/71   Pulse:  81 79 80  Resp:  16 12 16   Temp:  98.2 F (36.8 C) 98.5 F (36.9 C) 98.2 F (36.8 C)  TempSrc:  Oral Oral Oral  SpO2:  100% 100% 98%  Weight: 100.2 kg     Height: 6' (1.829 m)       Intake/Output Summary (Last 24 hours) at 02/05/2018 1045 Last data filed at 02/05/2018 0600 Gross per 24 hour  Intake 2847.85 ml  Output -  Net 2847.85 ml   Filed Weights   02/04/18 2007 02/05/18 0024  Weight: 96.6 kg 100.2 kg    Examination:  General exam: Appears calm and comfortable  Respiratory system: Clear to auscultation. Respiratory effort normal. Cardiovascular system: S1 & S2. No pedal edema. Gastrointestinal system: Abdomen is nondistended, soft and nontender. No organomegaly or masses felt. Normal bowel sounds heard. Central nervous system: Alert and oriented. No focal neurological deficits. Extremities: No leg edema  Data Reviewed: I have personally reviewed following labs and imaging studies  CBC: Recent Labs  Lab 02/04/18 2107  WBC 5.5  NEUTROABS 3.1  HGB 14.6  HCT 39.6  MCV 84.8  PLT 197   Basic Metabolic Panel: Recent Labs  Lab 02/04/18 2107 02/05/18 0040 02/05/18 0438 02/05/18 0739  NA 117* 120* 120* 119*  K 5.0 4.7 5.3* 5.3*  CL 84* 90* 91* 89*  CO2 22 22 21* 21*  GLUCOSE 102* 139* 134* 113*  BUN 51* 48* 48* 42*  CREATININE 1.87* 1.69* 1.71* 1.54*  CALCIUM 10.3 8.8* 9.0 9.0  MG  --  1.6*  --   --   PHOS  --  4.6  --   --    GFR: Estimated Creatinine Clearance: 63.3 mL/min (A) (by C-G formula based on SCr of 1.54 mg/dL (H)). Liver Function Tests: Recent Labs  Lab 02/04/18 2107 02/05/18 0040  AST 26 22  ALT 34 28  ALKPHOS 38 31*  BILITOT 1.0 0.6  PROT 7.6 5.8*  ALBUMIN 4.6 3.4*   Recent Labs  Lab  02/05/18 0040  LIPASE 46   No results for input(s): AMMONIA in the last 168 hours. Coagulation Profile: No results for input(s): INR, PROTIME in the last 168 hours. Cardiac Enzymes: No results for input(s): CKTOTAL, CKMB, CKMBINDEX, TROPONINI in the last 168 hours. BNP (last 3 results) No results for input(s): PROBNP in the last 8760 hours. HbA1C: No results for input(s): HGBA1C in the last 72 hours. CBG: No results for input(s): GLUCAP in the last 168 hours. Lipid Profile: No results for input(s): CHOL, HDL, LDLCALC, TRIG, CHOLHDL, LDLDIRECT in the last 72 hours. Thyroid Function Tests: No results for input(s): TSH, T4TOTAL, FREET4, T3FREE, THYROIDAB in the last 72 hours. Anemia Panel: No results for input(s): VITAMINB12, FOLATE, FERRITIN, TIBC, IRON, RETICCTPCT in the last 72 hours. Urine analysis:    Component Value Date/Time   COLORURINE YELLOW 02/04/2018 2120   APPEARANCEUR CLEAR 02/04/2018 2120   LABSPEC 1.018 02/04/2018 2120   PHURINE 5.0 02/04/2018 2120  GLUCOSEU NEGATIVE 02/04/2018 2120   HGBUR NEGATIVE 02/04/2018 2120   BILIRUBINUR NEGATIVE 02/04/2018 2120   KETONESUR 20 (A) 02/04/2018 2120   PROTEINUR NEGATIVE 02/04/2018 2120   NITRITE NEGATIVE 02/04/2018 2120   LEUKOCYTESUR NEGATIVE 02/04/2018 2120   Sepsis Labs: @LABRCNTIP (procalcitonin:4,lacticidven:4)  )No results found for this or any previous visit (from the past 240 hour(s)).       Radiology Studies: Dg Chest 2 View  Result Date: 02/04/2018 CLINICAL DATA:  Initial evaluation for acute hypotension. EXAM: CHEST - 2 VIEW COMPARISON:  None available. FINDINGS: The cardiac and mediastinal silhouettes are within normal limits. Aortic atherosclerosis. The lungs are normally inflated. No airspace consolidation, pleural effusion, or pulmonary edema is identified. There is no pneumothorax. No acute osseous abnormality identified. IMPRESSION: No active cardiopulmonary disease. Electronically Signed   By:  Rise Mu M.D.   On: 02/04/2018 21:17        Scheduled Meds: . enoxaparin (LOVENOX) injection  40 mg Subcutaneous Daily  . multivitamin with minerals  1 tablet Oral Daily  . omega-3 acid ethyl esters  1 g Oral Daily  . simvastatin  40 mg Oral QHS   Continuous Infusions:   LOS: 0 days    Time spent: 35 minutes    Berton Mount, MD  [facility] Pager #: [phone] 7PM-7AM contact night coverage as above

## 2018-02-06 DIAGNOSIS — I951 Orthostatic hypotension: Principal | ICD-10-CM

## 2018-02-06 DIAGNOSIS — E875 Hyperkalemia: Secondary | ICD-10-CM

## 2018-02-06 DIAGNOSIS — N183 Chronic kidney disease, stage 3 unspecified: Secondary | ICD-10-CM

## 2018-02-06 DIAGNOSIS — E2740 Unspecified adrenocortical insufficiency: Secondary | ICD-10-CM

## 2018-02-06 LAB — BASIC METABOLIC PANEL
Anion gap: 6 (ref 5–15)
Anion gap: 6 (ref 5–15)
Anion gap: 7 (ref 5–15)
BUN: 31 mg/dL — ABNORMAL HIGH (ref 6–20)
BUN: 30 mg/dL — ABNORMAL HIGH (ref 6–20)
BUN: 33 mg/dL — ABNORMAL HIGH (ref 6–20)
CO2: 21 mmol/L — ABNORMAL LOW (ref 22–32)
CO2: 23 mmol/L (ref 22–32)
CO2: 21 mmol/L — ABNORMAL LOW (ref 22–32)
Calcium: 8.7 mg/dL — ABNORMAL LOW (ref 8.9–10.3)
Calcium: 9.1 mg/dL (ref 8.9–10.3)
Calcium: 8.8 mg/dL — ABNORMAL LOW (ref 8.9–10.3)
Chloride: 96 mmol/L — ABNORMAL LOW (ref 98–111)
Chloride: 94 mmol/L — ABNORMAL LOW (ref 98–111)
Chloride: 96 mmol/L — ABNORMAL LOW (ref 98–111)
Creatinine, Ser: 1.57 mg/dL — ABNORMAL HIGH (ref 0.61–1.24)
Creatinine, Ser: 1.57 mg/dL — ABNORMAL HIGH (ref 0.61–1.24)
Creatinine, Ser: 1.6 mg/dL — ABNORMAL HIGH (ref 0.61–1.24)
GFR calc Af Amer: 55 mL/min — ABNORMAL LOW (ref >60)
GFR calc Af Amer: 55 mL/min — ABNORMAL LOW (ref >60)
GFR calc Af Amer: 54 mL/min — ABNORMAL LOW (ref >60)
GFR calc non Af Amer: 48 mL/min — ABNORMAL LOW (ref >60)
GFR calc non Af Amer: 48 mL/min — ABNORMAL LOW (ref >60)
GFR calc non Af Amer: 46 mL/min — ABNORMAL LOW (ref >60)
Glucose, Bld: 111 mg/dL — ABNORMAL HIGH (ref 70–99)
Glucose, Bld: 112 mg/dL — ABNORMAL HIGH (ref 70–99)
Glucose, Bld: 121 mg/dL — ABNORMAL HIGH (ref 70–99)
Potassium: 5.2 mmol/L — ABNORMAL HIGH (ref 3.5–5.1)
Potassium: 4.7 mmol/L (ref 3.5–5.1)
Potassium: 4.8 mmol/L (ref 3.5–5.1)
Sodium: 123 mmol/L — ABNORMAL LOW (ref 135–145)
Sodium: 123 mmol/L — ABNORMAL LOW (ref 135–145)
Sodium: 124 mmol/L — ABNORMAL LOW (ref 135–145)

## 2018-02-06 LAB — CBC WITH DIFFERENTIAL/PLATELET
Abs Immature Granulocytes: 0.02 10*3/uL (ref 0.00–0.07)
Basophils Absolute: 0.1 10*3/uL (ref 0.0–0.1)
Basophils Relative: 1 %
Eosinophils Absolute: 0.3 10*3/uL (ref 0.0–0.5)
Eosinophils Relative: 7 %
HCT: 36 % — ABNORMAL LOW (ref 39.0–52.0)
Hemoglobin: 12.4 g/dL — ABNORMAL LOW (ref 13.0–17.0)
Immature Granulocytes: 0 %
Lymphocytes Relative: 26 %
Lymphs Abs: 1.3 10*3/uL (ref 0.7–4.0)
MCH: 30.6 pg (ref 26.0–34.0)
MCHC: 34.4 g/dL (ref 30.0–36.0)
MCV: 88.9 fL (ref 80.0–100.0)
Monocytes Absolute: 0.8 10*3/uL (ref 0.1–1.0)
Monocytes Relative: 16 %
Neutro Abs: 2.5 10*3/uL (ref 1.7–7.7)
Neutrophils Relative %: 50 %
Platelets: 173 10*3/uL (ref 150–400)
RBC: 4.05 MIL/uL — ABNORMAL LOW (ref 4.22–5.81)
RDW: 12.9 % (ref 11.5–15.5)
WBC: 5 10*3/uL (ref 4.0–10.5)
nRBC: 0 % (ref 0.0–0.2)

## 2018-02-06 LAB — ACTH STIMULATION, 3 TIME POINTS
Cortisol, 30 Min: 4.2 ug/dL
Cortisol, 60 Min: 3.9 ug/dL
Cortisol, Base: 3.8 ug/dL

## 2018-02-06 LAB — SODIUM, URINE, RANDOM
Sodium, Ur: 47 mmol/L
Sodium, Ur: 52 mmol/L

## 2018-02-06 LAB — URIC ACID: URIC ACID, SERUM: 6.9 mg/dL (ref 3.7–8.6)

## 2018-02-06 LAB — OSMOLALITY: Osmolality: 262 mOsm/kg — ABNORMAL LOW (ref 275–295)

## 2018-02-06 LAB — OSMOLALITY, URINE
Osmolality, Ur: 368 mOsm/kg (ref 300–900)
Osmolality, Ur: 342 mOsm/kg (ref 300–900)

## 2018-02-06 LAB — CORTISOL: Cortisol, Plasma: 3.5 ug/dL

## 2018-02-06 MED ORDER — HYDROCORTISONE 10 MG PO TABS: 10.0000 mg | ORAL_TABLET | Freq: Every evening | ORAL | Status: DC

## 2018-02-06 MED ORDER — COSYNTROPIN 0.25 MG IJ SOLR
0.2500 mg | Freq: Once | INTRAMUSCULAR | Status: AC
  Administered 2018-02-06: 0.25 mg via INTRAVENOUS
  Filled 2018-02-06: qty 0.25

## 2018-02-06 MED ORDER — SODIUM CHLORIDE 0.9 % IV SOLN
INTRAVENOUS | Status: DC
  Administered 2018-02-07: 03:00:00 via INTRAVENOUS

## 2018-02-06 MED ORDER — COSYNTROPIN 0.25 MG IJ SOLR: 0.2500 mg | Freq: Once | INTRAMUSCULAR | Status: DC

## 2018-02-06 MED ORDER — ROPINIROLE HCL 0.25 MG PO TABS
0.2500 mg | ORAL_TABLET | Freq: Two times a day (BID) | ORAL | Status: DC | PRN
  Administered 2018-02-06: 0.25 mg via ORAL
  Filled 2018-02-06 (×2): qty 1

## 2018-02-06 MED ORDER — HYDROCORTISONE 5 MG PO TABS
15.0000 mg | ORAL_TABLET | Freq: Every day | ORAL | Status: DC
  Administered 2018-02-07: 15 mg via ORAL
  Filled 2018-02-06: qty 1

## 2018-02-06 NOTE — Progress Notes (Signed)
Orthostatic VS- Sitting 90/74   - Standing x 0 min : 81/49   - Standing x 3 min: 85/74

## 2018-02-06 NOTE — Progress Notes (Signed)
PROGRESS NOTE  Nicholas Coleman:[id number] DOB: 1960/12/30 DOA: 02/04/2018 PCP: Wilfrid Lund, PA  HPI/Recap of past 24 hours:  Feeling better, ambulating, denies dizziness  Assessment/Plan: Principal Problem:   Hypotension Active Problems:   Hyponatremia   Hypochloremia   AKI (acute kidney injury) (HCC)   Chronic abdominal pain  Hyponatremia/hyperkalemia/orthostatic hypotension: Likely from adrenal insufficiency He improved with hydration  Adrenal insufficiency: Case discussed with endocrinologist Dr Lonzo Cloud who recommended add-on ACTH level, then start patient on hydrocortisone 15 mg every morning and 10 mg every afternoon Follow-up with her in 4 to 6 weeks I have called to set up an appointment for the patient on December 30 at 8:15 AM.  CKDIII Stable at baseline  Telemetry unremarkable potassium now has normalized we will DC telemetry  Code Status: full  Family Communication: patient and wife at bedside  Disposition Plan: home in am   Consultants:  Endocrinologist  Procedures: None Antibiotics:  None   Objective: BP 102/65 (BP Location: Left Arm)   Pulse 75   Temp 98.6 F (37 C) (Oral)   Resp 10   Ht 6' (1.829 m)   Wt 100.2 kg   SpO2 98%   BMI 29.97 kg/m   Intake/Output Summary (Last 24 hours) at 02/06/2018 1540 Last data filed at 02/06/2018 0830 Gross per 24 hour  Intake 540 ml  Output -  Net 540 ml   Filed Weights   02/04/18 2007 02/05/18 0024  Weight: 96.6 kg 100.2 kg    Exam: Patient is examined daily including today on 02/06/2018, exams remain the same as of yesterday except that has changed    General:  NAD  Cardiovascular: RRR  Respiratory: CTABL  Abdomen: Soft/ND/NT, positive BS  Musculoskeletal: No Edema  Neuro: alert, oriented   Data Reviewed: Basic Metabolic Panel: Recent Labs  Lab 02/05/18 0040 02/05/18 0438 02/05/18 0739 02/05/18 1122 02/06/18 0413 02/06/18 1230  NA 120* 120* 119* 121* 123*  123*  K 4.7 5.3* 5.3* 5.0 5.2* 4.7  CL 90* 91* 89* 92* 96* 94*  CO2 22 21* 21* 20* 21* 23  GLUCOSE 139* 134* 113* 125* 111* 112*  BUN 48* 48* 42* 38* 31* 30*  CREATININE 1.69* 1.71* 1.54* 1.58* 1.57* 1.57*  CALCIUM 8.8* 9.0 9.0 8.6* 8.7* 9.1  MG 1.6*  --   --   --   --   --   PHOS 4.6  --   --   --   --   --    Liver Function Tests: Recent Labs  Lab 02/04/18 2107 02/05/18 0040  AST 26 22  ALT 34 28  ALKPHOS 38 31*  BILITOT 1.0 0.6  PROT 7.6 5.8*  ALBUMIN 4.6 3.4*   Recent Labs  Lab 02/05/18 0040  LIPASE 46   No results for input(s): AMMONIA in the last 168 hours. CBC: Recent Labs  Lab 02/04/18 2107 02/06/18 0413  WBC 5.5 5.0  NEUTROABS 3.1 2.5  HGB 14.6 12.4*  HCT 39.6 36.0*  MCV 84.8 88.9  PLT 197 173   Cardiac Enzymes:   No results for input(s): CKTOTAL, CKMB, CKMBINDEX, TROPONINI in the last 168 hours. BNP (last 3 results) No results for input(s): BNP in the last 8760 hours.  ProBNP (last 3 results) No results for input(s): PROBNP in the last 8760 hours.  CBG: No results for input(s): GLUCAP in the last 168 hours.  No results found for this or any previous visit (from the past 240 hour(s)).   Studies:  No results found.  Scheduled Meds: . enoxaparin (LOVENOX) injection  40 mg Subcutaneous Daily  . multivitamin with minerals  1 tablet Oral Daily  . omega-3 acid ethyl esters  1 g Oral Daily  . simvastatin  40 mg Oral QHS    Continuous Infusions:   Time spent: 35mins I have personally reviewed and interpreted on  02/06/2018 daily labs, tele strips, imagings as discussed above under date review session and assessment and plans.  I reviewed all nursing notes, pharmacy notes,  vitals, pertinent old records  I have discussed plan of care as described above with RN , patient and family on 02/06/2018   Albertine GratesFang Meleni Delahunt MD, PhD  [facility] Pager [phone]. If 7PM-7AM, please contact night-coverage at [email or web address], password [email or web address] 02/06/2018, 3:40 PM   LOS: 1 day

## 2018-02-07 LAB — IRON AND TIBC
IRON: 87 ug/dL (ref 45–182)
SATURATION RATIOS: 31 % (ref 17.9–39.5)
TIBC: 281 ug/dL (ref 250–450)
UIBC: 194 ug/dL

## 2018-02-07 LAB — CBC WITH DIFFERENTIAL/PLATELET
Abs Immature Granulocytes: 0.02 10*3/uL (ref 0.00–0.07)
Basophils Absolute: 0.1 10*3/uL (ref 0.0–0.1)
Basophils Relative: 2 %
EOS ABS: 0.5 10*3/uL (ref 0.0–0.5)
EOS PCT: 11 %
HEMATOCRIT: 35 % — AB (ref 39.0–52.0)
Hemoglobin: 11.8 g/dL — ABNORMAL LOW (ref 13.0–17.0)
Immature Granulocytes: 0 %
LYMPHS ABS: 1.3 10*3/uL (ref 0.7–4.0)
Lymphocytes Relative: 28 %
MCH: 29.6 pg (ref 26.0–34.0)
MCHC: 33.7 g/dL (ref 30.0–36.0)
MCV: 87.7 fL (ref 80.0–100.0)
MONO ABS: 0.7 10*3/uL (ref 0.1–1.0)
MONOS PCT: 14 %
Neutro Abs: 2.1 10*3/uL (ref 1.7–7.7)
Neutrophils Relative %: 45 %
PLATELETS: 192 10*3/uL (ref 150–400)
RBC: 3.99 MIL/uL — ABNORMAL LOW (ref 4.22–5.81)
RDW: 13.1 % (ref 11.5–15.5)
WBC: 4.6 10*3/uL (ref 4.0–10.5)
nRBC: 0 % (ref 0.0–0.2)

## 2018-02-07 LAB — BASIC METABOLIC PANEL
Anion gap: 8 (ref 5–15)
BUN: 31 mg/dL — ABNORMAL HIGH (ref 6–20)
CO2: 21 mmol/L — ABNORMAL LOW (ref 22–32)
Calcium: 8.9 mg/dL (ref 8.9–10.3)
Chloride: 100 mmol/L (ref 98–111)
Creatinine, Ser: 1.44 mg/dL — ABNORMAL HIGH (ref 0.61–1.24)
GFR calc Af Amer: >60 mL/min (ref >60)
GFR calc non Af Amer: 53 mL/min — ABNORMAL LOW (ref >60)
Glucose, Bld: 125 mg/dL — ABNORMAL HIGH (ref 70–99)
Potassium: 4.9 mmol/L (ref 3.5–5.1)
Sodium: 129 mmol/L — ABNORMAL LOW (ref 135–145)

## 2018-02-07 LAB — FERRITIN: FERRITIN: 413 ng/mL — AB (ref 24–336)

## 2018-02-07 MED ORDER — HYDROCORTISONE 10 MG PO TABS: 10.0000 mg | ORAL_TABLET | Freq: Every evening | ORAL | 1 refills | Status: DC

## 2018-02-07 MED ORDER — HYDROCORTISONE 5 MG PO TABS: 15.0000 mg | ORAL_TABLET | Freq: Every day | ORAL | 1 refills | Status: DC

## 2018-02-07 NOTE — Discharge Summary (Signed)
Discharge Summary  Nicholas Coleman ZOX:[id number] DOB: November 22, 1960  PCP: Wilfrid Lund, PA  Admit date: 02/04/2018 Discharge date: 02/08/2018  Time spent: , more than 50% time spent on coordination of care.  Recommendations for Outpatient Follow-up:  1. F/u with PCP within a week  for hospital discharge follow up, repeat cbc/bmp at follow up 2. F/u with endocrinology   Discharge Diagnoses:  Active Hospital Problems   Diagnosis Date Noted  . Hypotension 02/04/2018  . Hyperkalemia   . Adrenal insufficiency (HCC)   . CKD (chronic kidney disease), stage III (HCC)   . Hyponatremia 02/05/2018  . Hypochloremia 02/05/2018  . AKI (acute kidney injury) (HCC) 02/05/2018  . Chronic abdominal pain 02/05/2018    Resolved Hospital Problems  No resolved problems to display.    Discharge Condition: stable  Diet recommendation: regular diet  Filed Weights   02/04/18 2007 02/05/18 0024  Weight: 96.6 kg 100.2 kg    History of present illness: (per admitting MD Dr Loney Loh) PCP: Wilfrid Lund, PA Patient coming from: Urgent care  Chief Complaint: Hypotension  HPI: Nicholas Coleman is a 57 y.o. male with medical history significant of hypertension, hyperlipidemia, BPH presenting to the hospital from urgent care for hypotension after being on a detox diet for 10 days.  Patient states he has been on a detox diet for the past 10 days which requires you to eat a lot of protein, vitamins, and minerals.  No carbohydrate or sugars are allowed.  States on the fifth day of the diet he started experiencing generalized abdominal pain and restlessness of his legs.  He also felt dizzy and nauseous.  He had one episode of vomiting a few days ago.  He has not had any diarrhea.  He has not been eating or drinking much since then.  States on day 4 of the diet he had noticed that his blood pressure was low and he stopped taking his home blood pressure medications.  Does state that his abdominal pain  is chronic since April and he has been seen by GI (Dr. Loreta Ave) in the past and extensive work-up has been negative.  States his abdominal pain was recently under control until he started this new diet.  ED Course: Blood pressure 79/53 on arrival.  Afebrile, not tachycardic, not tachypneic, and satting well on room air.  No leukocytosis.  Sodium 117, chloride 84, BUN 51, creatinine 1.8.  No recent labs, creatinine was 1.1 two years ago.  I-STAT troponin negative.  UA with ketones but not suggestive of infection.  Chest x-ray showing no active cardiopulmonary disease.  Patient received 1 L normal saline bolus in the ED.   Hospital Course:  Principal Problem:   Hypotension Active Problems:   Hyponatremia   Hypochloremia   AKI (acute kidney injury) (HCC)   Chronic abdominal pain   Hyperkalemia   Adrenal insufficiency (HCC)   CKD (chronic kidney disease), stage III (HCC)   Hyponatremia/hyperkalemia/orthostatic hypotension: Likely from adrenal insufficiency He improved with hydration  Adrenal insufficiency: Cortisol did not increase after stim Case discussed with endocrinologist Dr Lonzo Cloud who recommended add-on ACTH level, then start patient on hydrocortisone 15 mg every morning and 10 mg every afternoon Follow-up with her in 4 to 6 weeks I have called to set up an appointment for the patient on December 30 at 8:15 AM.  Addendum: ATCH level elevated  CKDIII Stable at baseline  Anemia of chronic disease: hgb 11.8 Follow up with pcp   Code Status:  full  Family Communication: patient and wife at bedside  Disposition Plan: home    Consultants:  Endocrinologist  Procedures: None Antibiotics:  None  Discharge Exam: BP (!) 87/54 (BP Location: Right Arm)   Pulse 87   Temp 98.7 F (37.1 C) (Oral)   Resp 18   Ht 6' (1.829 m)   Wt 100.2 kg   SpO2 98%   BMI 29.97 kg/m   General: NAD Cardiovascular: RRR Respiratory: CTABL  Discharge Instructions You  were cared for by a hospitalist during your hospital stay. If you have any questions about your discharge medications or the care you received while you were in the hospital after you are discharged, you can call the unit and asked to speak with the hospitalist on call if the hospitalist that took care of you is not available. Once you are discharged, your primary care physician will handle any further medical issues. Please note that NO REFILLS for any discharge medications will be authorized once you are discharged, as it is imperative that you return to your primary care physician (or establish a relationship with a primary care physician if you do not have one) for your aftercare needs so that they can reassess your need for medications and monitor your lab values.  Discharge Instructions    Diet general   Complete by:  As directed    Increase activity slowly   Complete by:  As directed      Allergies as of 02/07/2018      Reactions   Hydrocodone-acetaminophen Anxiety, Other (See Comments)   Insomnia, "jittery"   Oxycodone Anxiety, Other (See Comments)   Insomnia, "jittery"      Medication List    STOP taking these medications   aspirin 500 MG EC tablet   hydrochlorothiazide 25 MG tablet Commonly known as:  HYDRODIURIL     TAKE these medications   alfuzosin 10 MG 24 hr tablet Commonly known as:  UROXATRAL Take 10 mg by mouth at bedtime.   FISH OIL PO Take 1 capsule by mouth daily.   hydrocortisone 5 MG tablet Commonly known as:  CORTEF Take 3 tablets (15 mg total) by mouth daily with breakfast.   hydrocortisone 10 MG tablet Commonly known as:  CORTEF Take 1 tablet (10 mg total) by mouth every evening.   multivitamin capsule Take 1 capsule by mouth daily.   oxyCODONE 5 MG immediate release tablet Commonly known as:  Oxy IR/ROXICODONE Take 5 mg by mouth every 4 (four) hours as needed for severe pain.   simvastatin 40 MG tablet Commonly known as:  ZOCOR Take 40 mg  by mouth at bedtime.      Allergies  Allergen Reactions  . Hydrocodone-Acetaminophen Anxiety and Other (See Comments)    Insomnia, "jittery"  . Oxycodone Anxiety and Other (See Comments)    Insomnia, "jittery"   Follow-up Information    Shamleffer, Konrad DoloresIbtehal Jaralla, MD Follow up on 03/11/2018.   Specialty:  Endocrinology Why:  8:50 am please arrive 15mins early for paper work Contact information: [address]  [address] [place] [address] [phone]        Wilfrid LundBecker, Anna G, PA Follow up in 4 day(s).   Specialty:  Family Medicine Why:  hospital discharge follow up, repeat cbc/bmp at follow up. 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 Contact information: [address] [place] [address] [phone]  The results of significant diagnostics from this hospitalization (including imaging, microbiology, ancillary and laboratory) are listed below for reference.    Significant Diagnostic Studies: Dg Chest 2 View  Result Date: 02/04/2018 CLINICAL DATA:  Initial evaluation for acute hypotension. EXAM: CHEST - 2 VIEW COMPARISON:  None available. FINDINGS: The cardiac and mediastinal silhouettes are within normal limits. Aortic atherosclerosis. The lungs are normally inflated. No airspace consolidation, pleural effusion, or pulmonary edema is identified. There is no pneumothorax. No acute osseous abnormality identified. IMPRESSION: No active cardiopulmonary disease. Electronically Signed   By: Rise Mu M.D.   On: 02/04/2018 21:17    Microbiology: No results found for this or any previous visit (from the past 240 hour(s)).   Labs: Basic Metabolic Panel: Recent Labs  Lab 02/05/18 0040  02/05/18 1122 02/06/18 0413 02/06/18 1230 02/06/18 1609 02/07/18 0324  NA 120*   < > 121* 123* 123* 124* 129*  K 4.7   < > 5.0 5.2* 4.7 4.8 4.9  CL 90*   < > 92* 96* 94* 96* 100  CO2 22   < > 20* 21* 23 21*  21*  GLUCOSE 139*   < > 125* 111* 112* 121* 125*  BUN 48*   < > 38* 31* 30* 33* 31*  CREATININE 1.69*   < > 1.58* 1.57* 1.57* 1.60* 1.44*  CALCIUM 8.8*   < > 8.6* 8.7* 9.1 8.8* 8.9  MG 1.6*  --   --   --   --   --   --   PHOS 4.6  --   --   --   --   --   --    < > = values in this interval not displayed.   Liver Function Tests: Recent Labs  Lab 02/04/18 2107 02/05/18 0040  AST 26 22  ALT 34 28  ALKPHOS 38 31*  BILITOT 1.0 0.6  PROT 7.6 5.8*  ALBUMIN 4.6 3.4*   Recent Labs  Lab 02/05/18 0040  LIPASE 46   No results for input(s): AMMONIA in the last 168 hours. CBC: Recent Labs  Lab 02/04/18 2107 02/06/18 0413 02/07/18 0324  WBC 5.5 5.0 4.6  NEUTROABS 3.1 2.5 2.1  HGB 14.6 12.4* 11.8*  HCT 39.6 36.0* 35.0*  MCV 84.8 88.9 87.7  PLT 197 173 192   Cardiac Enzymes: No results for input(s): CKTOTAL, CKMB, CKMBINDEX, TROPONINI in the last 168 hours. BNP: BNP (last 3 results) No results for input(s): BNP in the last 8760 hours.  ProBNP (last 3 results) No results for input(s): PROBNP in the last 8760 hours.  CBG: No results for input(s): GLUCAP in the last 168 hours.     Signed:  Albertine Grates MD, PhD  [facility] 02/08/2018, 8:48 PM

## 2018-02-08 LAB — ACTH
C206 ACTH: 363 pg/mL — AB (ref 7.2–63.3)
C206 ACTH: 423 pg/mL — ABNORMAL HIGH (ref 7.2–63.3)

## 2018-03-11 ENCOUNTER — Encounter: Payer: Self-pay | Admitting: Internal Medicine

## 2018-03-11 ENCOUNTER — Ambulatory Visit: Payer: Managed Care, Other (non HMO) | Admitting: Internal Medicine

## 2018-03-11 VITALS — BP 130/84 | HR 80 | Ht 72.25 in | Wt 233.2 lb

## 2018-03-11 DIAGNOSIS — E271 Primary adrenocortical insufficiency: Secondary | ICD-10-CM | POA: Diagnosis not present

## 2018-03-11 LAB — BASIC METABOLIC PANEL
BUN: 28 mg/dL — AB (ref 6–23)
CALCIUM: 9.3 mg/dL (ref 8.4–10.5)
CO2: 24 mEq/L (ref 19–32)
CREATININE: 1.17 mg/dL (ref 0.40–1.50)
Chloride: 99 mEq/L (ref 96–112)
GFR: 67.73 mL/min (ref >60.00)
Glucose, Bld: 96 mg/dL (ref 70–99)
POTASSIUM: 4.6 meq/L (ref 3.5–5.1)
Sodium: 131 mEq/L — ABNORMAL LOW (ref 135–145)

## 2018-03-11 MED ORDER — HYDROCORTISONE 5 MG PO TABS: ORAL_TABLET | ORAL | 11 refills | Status: DC

## 2018-03-11 NOTE — Progress Notes (Signed)
Name: Nicholas PostinWilliam A Coleman  MRN/ DOB: [id number], 1960-05-05    Age/ Sex: 57 y.o., male    PCP: Nicholas Coleman   Reason for Endocrinology Evaluation: Adrenal Insufficiency      Date of Initial Endocrinology Evaluation: 03/11/2018     HPI: [name] is a 57 y.o. male with a past medical history of HTN, Dyslipidemia and OSA . The patient presented for initial endocrinology clinic visit on 03/11/2018 for consultative assistance with his Adrenal insufficiency   Pt presented to the ED on 11/25 with c/o nausea, lightheadedness and DOE. He has noted hypotension at home while checking his BP , as he has been checking it at home with systolic in the 70's.   Upon presentation to the ED, he was noted with hyponatremia with a serum sodium Nadir of 117 mEq/L and high normal K= of 5.0 mEq/L .  A cosyntropin Stimulation test was abnormal with a high ACTH  Level 363.0 pg/mL    Pt recalls having abdominal pains that started in March/April 2019. GI work up was unrevealing . He has tried two different diets for weight loss, where he felt poorly . Initial it was the ketogenic diet but then switched to Nicholas Coleman 10 day detox diet, which involved buying and consuming certain supplements . He had just completed the course when he presented to the ED.   He moved from [place] 3 [place]rs ago    He was started on HC 15 mg QAM and 10 mg QAM (3-4 pm) He has felt better since being on Nicholas Coleman, his energy level back to normal, he has gained weight.  He denies any abdominal pain or nausea.  Denies dizziness. When he was initially started on [facility], he noted headaches but these have dissipated.  He is c/o joint pains and aches.    Medications :  HC 15 qAM and 10 mg  QPM    No FH of thyroid  Or autoimmune disease, that he is aware of.      HISTORY:  Past Medical History:  Past Medical History:  Diagnosis Date  . AKI (acute kidney injury) (HCC) 02/05/2018  . Atypical chest pain 12/11/2017  . BPH  (benign prostatic hyperplasia) 07/12/2015  . Essential hypertension 07/12/2015  . Exertional dyspnea 12/11/2017  . Hyperlipidemia   . Hypertension     Past Surgical History: No past surgical history on file.   Social History:  reports that he has quit smoking. He has never used smokeless tobacco.  Family History: family history includes CAD in his father; Lymphoma in his mother; Stroke in his father.   HOME MEDICATIONS: Current Outpatient Medications on File Prior to Visit  Medication Sig Dispense Refill  . alfuzosin (UROXATRAL) 10 MG 24 hr tablet Take 10 mg by mouth at bedtime.     . hydrocortisone (CORTEF) 10 MG tablet Take 1 tablet (10 mg total) by mouth every evening. 30 tablet 1  . hydrocortisone (CORTEF) 5 MG tablet Take 3 tablets (15 mg total) by mouth daily with breakfast. 90 tablet 1  . Multiple Vitamin (MULTIVITAMIN) capsule Take 1 capsule by mouth daily.    . Omega-3 Fatty Acids (FISH OIL PO) Take 1 capsule by mouth daily.     Marland Kitchen. oxyCODONE (OXY IR/ROXICODONE) 5 MG immediate release tablet Take 5 mg by mouth every 4 (four) hours as needed for severe pain.    . simvastatin (ZOCOR) 40 MG tablet Take 40 mg by mouth at bedtime.  No current facility-administered medications on file prior to visit.       REVIEW OF SYSTEMS: A comprehensive ROS was conducted with the patient and is negative except as per HPI and below:  Review of Systems  Constitutional: Negative for malaise/fatigue.  HENT: Negative for congestion and sore throat.   Respiratory: Positive for cough. Negative for shortness of breath.   Cardiovascular: Negative.   Gastrointestinal: Negative for abdominal pain and nausea.  Genitourinary: Negative for frequency.  Musculoskeletal: Positive for joint pain.  Skin: Positive for itching and rash.  Neurological: Positive for headaches.  Endo/Heme/Allergies: Negative for polydipsia.  Psychiatric/Behavioral: Negative for depression. The patient is nervous/anxious.         OBJECTIVE:  VS: BP 130/84 (BP Location: Right Arm, Patient Position: Sitting, Cuff Size: Normal)   Pulse 80   Ht 6' 0.25" (1.835 m)   Wt 233 lb 3.2 oz (105.8 kg)   SpO2 97%   BMI 31.41 kg/m    Body surface area is 2.32 meters squared.   Wt Readings from Last 3 Encounters:  03/11/18 233 lb 3.2 oz (105.8 kg)  02/05/18 221 lb (100.2 kg)  01/22/18 230 lb 9.6 oz (104.6 kg)     EXAM: General: Pt appears well and is in NAD  Hydration: Well-hydrated with moist mucous membranes and good skin turgor  Eyes: External eye exam normal without stare, lid lag or exophthalmos.  EOM intact.    Ears, Nose, Throat: Hearing: Grossly intact bilaterally Dental: Good dentition  Throat: Clear without mass, erythema or exudate  Neck: General: Supple without adenopathy. Thyroid: Thyroid size normal.  No goiter or nodules appreciated. No thyroid bruit.  Lungs: Clear with good BS bilat with no rales, rhonchi, or wheezes  Heart: Auscultation: RRR.  Abdomen: Normoactive bowel sounds, soft, nontender, without masses or organomegaly palpable  Extremities:  BL LE: No pretibial edema normal ROM and strength.  Skin: Hair: Texture and amount normal with gender appropriate distribution Skin Inspection: No rashes. Skin Palpation: Skin temperature, texture, and thickness normal to palpation  Neuro: Cranial nerves: II - XII grossly intact  Motor: Normal strength throughout DTRs: 2+ and symmetric in UE without delay in relaxation phase  Mental Status: Judgment, insight: Intact Orientation: Oriented to time, place, and person Mood and affect: No depression, anxiety, or agitation     DATA REVIEWED: Results for Nicholas, Coleman (MRN [id number]) as of 03/11/2018 09:27  Ref. Range 02/06/2018 16:05 02/07/2018 07:00  C206 ACTH Latest Ref Range: 7.2 - 63.3 pg/mL 363.0 (H) 423.0 (H)    Results for Nicholas, Coleman (MRN [id number]) as of 03/11/2018 09:27  Ref. Range 02/06/2018 06:28  Cortisol, Base Latest  Units: ug/dL 3.8  Cortisol, 30 Min Latest Units: ug/dL 4.2  Cortisol, 60 Min Latest Units: ug/dL 3.9  Results for [name], SEDIVY (MRN [id number]) as of 03/12/2018 14:52  Ref. Range 03/11/2018 09:18  Sodium Latest Ref Range: 135 - 145 mEq/L 131 (L)  Potassium Latest Ref Range: 3.5 - 5.1 mEq/L 4.6  Chloride Latest Ref Range: 96 - 112 mEq/L 99  CO2 Latest Ref Range: 19 - 32 mEq/L 24  Glucose Latest Ref Range: 70 - 99 mg/dL 96  BUN Latest Ref Range: 6 - 23 mg/dL 28 (H)  Creatinine Latest Ref Range: 0.40 - 1.50 mg/dL 4.74  Calcium Latest Ref Range: 8.4 - 10.5 mg/dL 9.3  GFR Latest Ref Range: >60.00 mL/min 67.73   CT abdomen 11/19/2017  Adrenals/Urinary Tract: Normal adrenals. Simple 1.3 cm lower left renal  cyst. Subcentimeter hypodense renal cortical lesions in both kidneys are too small to characterize and require no follow-up. No hydronephrosis. Normal bladder.  Stomach/Bowel: Normal non-distended stomach. Normal caliber small bowel with no small bowel wall thickening. Normal appendix. Mild left colonic diverticulosis, with no large bowel wall thickening or significant pericolonic fat stranding. Oral contrast transits to the sigmoid colon.   Old records , labs and images have been reviewed.   ASSESSMENT/PLAN/RECOMMENDATIONS:   1. Primary Adrenal Insufficiency :  - Clinically he feels much better  - Repeat his electrolytes today continue to show hyponatremia, potassium in the high normal range, will proceed with starting fludrocortisone. Cautioned against edema.  - We discussed different causes of Primary Adrenal insufficiency, the commonest being autoimmune, pt has no evidence of adrenal hge on CT scan, no evidence of metastatic or infectious causes.  - We discussed HC is a life saving medicine and he will need to be on it for life. We also reviewed sick day rules in details - Pt advised to obtain a medical alert bracelet for adrenal insufficiency.    Medications : Continue  Hydrocortisone 15 mg QAM Decrease Hydrocortisone to 5 mg QPM (1400-1600) Fludrocortisone 0.05 mg daily   F/U in 3 months   Signed electronically by: Lyndle HerrlichAbby Jaralla Shamleffer, MD  [facility]  [facility] [address] [place], [place] [address] Phone: [phone] FAX: [phone]   CC: Nicholas Coleman [address] [place] [place] [address] Phone: [phone] Fax: [phone]   Return to Endocrinology clinic as below: Future Appointments  Date Time Provider [facility]  03/11/2018  8:50 AM Shamleffer, Konrad DoloresIbtehal Jaralla, MD [facility] None

## 2018-03-11 NOTE — Patient Instructions (Addendum)
-  Continue Hydrocortisone 15 mg with breakfast  - Decrease Hydrocortisone to 5 mg every afternoon between 2-4 pm   ADRENAL INSUFFICIENCY SICK DAY RULES:  Should you face an extreme emotional or physical stress such as trauma, surgery or acute illness, this will require extra steroid coverage so that the body can meet that stress.   Without increasing the steroid dose you may experience severe weakness, headache, dizziness, nausea and vomiting and possibly a more serious deterioration in health.  Typically the dose of steroids will only need to be increased for a couple of days if you have an illness that is transient and managed in the community.   If you are unable to take/absorb an increased dose of steroids orally because of vomiting or diarrhea, you will urgently require steroid injections and should present to an Emergency Department.  The general advice for any serious illness is as follows: 1. Double the normal daily steroid dose for up to 3 days if you have a temperature of more than 37.50C (99.104F) with signs of sickness, or severe emotional or physical distress 2. Contact your primary care doctor and Endocrinologist if the illness worsens or it lasts for more than 3 days.  3. In cases of severe illness, urgent medical assistance should be promptly sought. 4. If you experience vomiting/diarrhea or are unable to take steroids by mouth, please administer the Hydrocortisone injection kit and seek urgent medical help.

## 2018-03-12 MED ORDER — FLUDROCORTISONE ACETATE 0.1 MG PO TABS: 0.0500 mg | ORAL_TABLET | Freq: Every day | ORAL | 6 refills | Status: DC

## 2018-03-14 LAB — ACTH: C206 ACTH: 839 pg/mL — AB (ref 6–50)

## 2018-03-15 LAB — ALDOSTERONE + RENIN ACTIVITY W/O RATIO
ALDOSTERONE: <1 ng/dL (ref 0.0–30.0)
Renin: 21.739 ng/mL/hr — ABNORMAL HIGH (ref 0.167–5.380)

## 2018-03-18 ENCOUNTER — Encounter: Payer: Self-pay | Admitting: Internal Medicine

## 2018-05-28 ENCOUNTER — Telehealth: Payer: Self-pay | Admitting: Internal Medicine

## 2018-05-28 NOTE — Telephone Encounter (Signed)
Please have pt contact pharmacy, refill was sent 02/2018 with 11 refills

## 2018-05-28 NOTE — Telephone Encounter (Signed)
MEDICATION:   hydrocortisone (CORTEF) 5 MG tablet     PHARMACY:  [id number] - [place], [place] - [address] AT [address]  IS THIS A 90 DAY SUPPLY :   IS PATIENT OUT OF MEDICATION:  no  IF NOT; HOW MUCH IS LEFT: one day left  LAST APPOINTMENT DATE: @12 /30/2019  NEXT APPOINTMENT DATE:@3 /30/2020  DO WE HAVE YOUR PERMISSION TO LEAVE A DETAILED MESSAGE:  OTHER COMMENTS:    **Let patient know to contact pharmacy at the end of the day to make sure medication is ready. **  ** Please notify patient to allow 48-72 hours to process**  **Encourage patient to contact the pharmacy for refills or they can request refills through [facility]**

## 2018-06-10 ENCOUNTER — Encounter: Payer: Self-pay | Admitting: Internal Medicine

## 2018-06-10 ENCOUNTER — Ambulatory Visit (INDEPENDENT_AMBULATORY_CARE_PROVIDER_SITE_OTHER): Payer: Managed Care, Other (non HMO) | Admitting: Internal Medicine

## 2018-06-10 ENCOUNTER — Other Ambulatory Visit: Payer: Self-pay

## 2018-06-10 VITALS — BP 128/60 | HR 85 | Temp 97.7°F | Ht 72.25 in | Wt 239.0 lb

## 2018-06-10 DIAGNOSIS — E271 Primary adrenocortical insufficiency: Secondary | ICD-10-CM | POA: Diagnosis not present

## 2018-06-10 MED ORDER — FLUDROCORTISONE ACETATE 0.1 MG PO TABS: 0.0500 mg | ORAL_TABLET | Freq: Every day | ORAL | 3 refills | Status: DC

## 2018-06-10 MED ORDER — HYDROCORTISONE 5 MG PO TABS: ORAL_TABLET | ORAL | 3 refills | Status: DC

## 2018-06-10 NOTE — Patient Instructions (Signed)
-  Continue Hydrocortisone 15 mg with breakfast  - Continue Hydrocortisone to 5 mg every afternoon between 2-4 pm  - Continue Florinef 0.05 mg (half a tablet daily ) - Obtain a medical alert bracelet   ADRENAL INSUFFICIENCY SICK DAY RULES:  Should you face an extreme emotional or physical stress such as trauma, surgery or acute illness, this will require extra steroid coverage so that the body can meet that stress.   Without increasing the steroid dose you may experience severe weakness, headache, dizziness, nausea and vomiting and possibly a more serious deterioration in health.  Typically the dose of steroids will only need to be increased for a couple of days if you have an illness that is transient and managed in the community.   If you are unable to take/absorb an increased dose of steroids orally because of vomiting or diarrhea, you will urgently require steroid injections and should present to an Emergency Department.  The general advice for any serious illness is as follows: 1. Double the normal daily steroid dose for up to 3 days if you have a temperature of more than 37.50C (99.47F) with signs of sickness, or severe emotional or physical distress 2. Contact your primary care doctor and Endocrinologist if the illness worsens or it lasts for more than 3 days.  3. In cases of severe illness, urgent medical assistance should be promptly sought. 4. If you experience vomiting/diarrhea or are unable to take steroids by mouth, please administer the Hydrocortisone injection kit and seek urgent medical help.

## 2018-06-10 NOTE — Progress Notes (Signed)
Name: Nicholas Coleman  MRN/ DOB: [id number], [birth year]    Age/ Sex: 58 y.o., male     PCP: Wilfrid Lund, PA   Reason for Endocrinology Evaluation: Adrenal insufficiency     Initial Endocrinology Clinic Visit: 03/11/2018    PATIENT IDENTIFIER: [name] is a 58 y.o., male with a past medical history of HTN, Dyslipidemia and OSA. He has followed with [facility] since 03/12/2019 for consultative assistance with management of his adrenal insufficiency.   HISTORICAL SUMMARY: The patient was first diagnosed with primary adrenal insufficiency in 01/2018 after presenting to the ED with hypotension, nausea and lightheadedness. IN the ED, he was noted with hyponatremia with a serum sodium Nadir of 117 mEq/L and high normal K= of 5.0 mEq/L .  A cosyntropin Stimulation test was abnormal 60- minute cortisol level of 3.9 ug/dL, with a high ACTH  Level 363.0 pg/mL   He was discharged on HC 15 mg QAM and 10 mg QPM and florinef.  No FH of thyroid  Or autoimmune disease, that he is aware of.    SUBJECTIVE:   During last visit (03/11/2018): We continued HC at 15 mg QAM and decreased afternoon dose to 5 mg daily.   Today (06/11/2018):  Nicholas Coleman is here for a 3 month follow up on primary adrenal insufficiency.  He is compliant with morning HC dose but tends to forget the afternoon dose. He does not feel any different when he skips his afternoon dose.  He denies fatigue or dizziness. No nausea. Weight is stable    Endocrine Medications  Hydrocortisone 15 mg QAM Hydrocortisone to 5 mg QPM (1400-1600) Fludrocortisone 0.05 mg daily     ROS:  As per HPI.   HISTORY:  Past Medical History:  Past Medical History:  Diagnosis Date  . AKI (acute kidney injury) (HCC) 02/05/2018  . Atypical chest pain 12/11/2017  . BPH (benign prostatic hyperplasia) 07/12/2015  . Essential hypertension 07/12/2015  . Exertional dyspnea 12/11/2017  . Hyperlipidemia   . Hypertension      Past Surgical History: No past surgical history on file.  Social History:  reports that he has quit smoking. He has never used smokeless tobacco. No history on file for alcohol and drug. Family History:  Family History  Problem Relation Age of Onset  . Lymphoma Mother   . Stroke Father   . CAD Father      HOME MEDICATIONS: Allergies as of 06/10/2018      Reactions   Hydrocodone-acetaminophen Anxiety, Other (See Comments)   Insomnia, "jittery"   Oxycodone Anxiety, Other (See Comments)   Insomnia, "jittery"      Medication List       Accurate as of June 10, 2018 11:59 PM. Always use your most recent med list.        alfuzosin 10 MG 24 hr tablet Commonly known as:  UROXATRAL Take 10 mg by mouth at bedtime.   FISH OIL PO Take 1 capsule by mouth daily.   fludrocortisone 0.1 MG tablet Commonly known as:  FLORINEF Take 0.5 tablets (0.05 mg total) by mouth daily.   hydrocortisone 5 MG tablet Commonly known as:  CORTEF Take 3 tablets (15 mg total) by mouth daily with breakfast AND 1 tablet (5 mg total) every evening.   multivitamin capsule Take 1 capsule by mouth daily.   oxyCODONE 5 MG immediate release tablet Commonly known as:  Oxy IR/ROXICODONE Take 5 mg by mouth every 4 (four) hours as  needed for severe pain.   simvastatin 40 MG tablet Commonly known as:  ZOCOR Take 40 mg by mouth at bedtime.         OBJECTIVE:   PHYSICAL EXAM: VS: BP 128/60   Pulse 85   Temp 97.7 F (36.5 C)   Ht 6' 0.25" (1.835 m)   Wt 239 lb (108.4 kg)   BMI 32.19 kg/m    Body surface area is 2.35 meters squared.   EXAM: General: Pt appears well and is in NAD  Ears, Nose, Throat: Hearing: Grossly intact bilaterally Dental: Good dentition  Throat: Clear without mass, erythema or exudate  Neck: General: Supple without adenopathy. Thyroid: Thyroid size normal.  No goiter or nodules appreciated. No thyroid bruit.  Lungs: Clear with good BS bilat with no rales, rhonchi, or  wheezes  Heart: Auscultation: RRR.  Abdomen: Normoactive bowel sounds, soft, nontender, without masses or organomegaly palpable  Extremities:  BL LE: No pretibial edema normal ROM and strength.  Skin: Hair: Texture and amount normal with gender appropriate distribution Skin Inspection: No rashes.Pt with tan color on exposed area, no hyperpigmentation of the buccal mucosa or palms.  Skin Palpation: Skin temperature, texture, and thickness normal to palpation  Neuro: Cranial nerves: II - XII grossly intact  Motor: Normal strength throughout DTRs: 2+ and symmetric in UE without delay in relaxation phase  Mental Status: Judgment, insight: Intact Memory: Intact for recent and remote events Mood and affect: No depression, anxiety, or agitation     DATA REVIEWED: Results for Nicholas Coleman, Nicholas Coleman (MRN [id number]) as of 06/10/2018 12:41  Ref. Range 03/11/2018 09:18  ALDOSTERONE Latest Ref Range: 0.0 - 30.0 ng/dL <8.9  Renin Latest Ref Range: 0.167 - 5.380 ng/mL/hr 21.739 (H)   Results for Nicholas Coleman, Nicholas Coleman (MRN [id number]) as of 06/10/2018 12:41  Ref. Range 02/06/2018 06:28  Cortisol, Base Latest Units: ug/dL 3.8  Cortisol, 30 Min Latest Units: ug/dL 4.2  Cortisol, 60 Min Latest Units: ug/dL 3.9   Results for Nicholas Coleman, Nicholas Coleman (MRN [id number]) as of 06/10/2018 12:41  Ref. Range 02/06/2018 16:05 02/07/2018 07:00 03/11/2018 09:18  C206 ACTH Latest Ref Range: 6 - 50 pg/mL 363.0 (H) 423.0 (H) 839 (H)   Results for Nicholas Coleman, Nicholas Coleman (MRN [id number]) as of 06/11/2018 16:04  Ref. Range 06/10/2018 15:55  Sodium Latest Ref Range: 135 - 145 mEq/L 132 (L)  Potassium Latest Ref Range: 3.5 - 5.1 mEq/L 4.5  Chloride Latest Ref Range: 96 - 112 mEq/L 98  CO2 Latest Ref Range: 19 - 32 mEq/L 26  Glucose Latest Ref Range: 70 - 99 mg/dL 453 (H)  BUN Latest Ref Range: 6 - 23 mg/dL 28 (H)  Creatinine Latest Ref Range: 0.40 - 1.50 mg/dL 6.46 (H)  Calcium Latest Ref Range: 8.4 - 10.5 mg/dL 9.6  GFR Latest Ref  Range: >60.00 mL/min 46.72 (L)   ASSESSMENT / PLAN / RECOMMENDATIONS:   1. Primary Adrenal Insufficiency :  - Unclear cause at this time, iron studies were unremarkable, CT scan was normal around the time of his diagnosis. He was on this high fat diet at the time.  - Discussed the importance of obtaining a medical alert bracelet. - Discussed the importance of compliance with HC  - Repeat BMP today continues to show low Na and high-normal potassium, will adjust florinef dose as below  - We again reviewed the sick day rule.    Medications  Stop Hydrocortisone  Start Prednisone 5 mg daily with Breakfast  Increase Florinef to  0.1 mg daily     F/u in 6 months   Addendum : spoke to the patient about his BMP, and above recommendations on 06/11/2018 at 1605.   Signed electronically by: Lyndle Herrlich, MD  [facility]  [facility] [address] [place], [place] [address] Phone: [phone] FAX: [phone]      CC: Wilfrid Lund, PA [address] [place] [address] Phone: [phone]  Fax: [phone]   Return to Endocrinology clinic as below: Future Appointments  Date Time Provider Department Center  12/09/2018  9:10 AM Shamleffer, Konrad Dolores, MD [facility] None

## 2018-06-11 LAB — BASIC METABOLIC PANEL
BUN: 28 mg/dL — ABNORMAL HIGH (ref 6–23)
CO2: 26 mEq/L (ref 19–32)
CREATININE: 1.53 mg/dL — AB (ref 0.40–1.50)
Calcium: 9.6 mg/dL (ref 8.4–10.5)
Chloride: 98 mEq/L (ref 96–112)
GFR: 46.72 mL/min — AB (ref >60.00)
Glucose, Bld: 114 mg/dL — ABNORMAL HIGH (ref 70–99)
Potassium: 4.5 mEq/L (ref 3.5–5.1)
SODIUM: 132 meq/L — AB (ref 135–145)

## 2018-06-11 MED ORDER — FLUDROCORTISONE ACETATE 0.1 MG PO TABS: 0.1000 mg | ORAL_TABLET | Freq: Every day | ORAL | 3 refills | Status: DC

## 2018-06-11 MED ORDER — PREDNISONE 5 MG PO TABS: 5.0000 mg | ORAL_TABLET | Freq: Every day | ORAL | 3 refills | Status: DC

## 2018-08-09 ENCOUNTER — Telehealth: Payer: Self-pay | Admitting: Internal Medicine

## 2018-08-09 NOTE — Telephone Encounter (Signed)
Pt stated that he has been taking florinef and prednisone since prescribed to him on 06/11/2018 and that he just has no motivation to do anything and wanted to know if those feelings could be from the medication, please advise.

## 2018-08-09 NOTE — Telephone Encounter (Signed)
Patients states he has started the new RX's, but he states he has lost all motivation and feels that the RX is effecting him.  Please Advise, Thanks

## 2018-08-09 NOTE — Telephone Encounter (Signed)
Pt aware.

## 2018-08-20 ENCOUNTER — Telehealth: Payer: Self-pay | Admitting: Internal Medicine

## 2018-08-20 NOTE — Telephone Encounter (Signed)
Please advise 

## 2018-08-20 NOTE — Telephone Encounter (Signed)
Patient called to advise that he is not feeling well.  He is experiencing the following symptoms: lack of energy, wiped out,   He did take an dditional dose of his medications and felt better.  Wants to find out how to proceed.  Please call back at [phone]

## 2018-08-20 NOTE — Telephone Encounter (Signed)
Pt stated that he has gained about 5 lbs. Informed pt of what Dr. Kelton Pillar advised pt stated that he understood.

## 2018-08-20 NOTE — Telephone Encounter (Signed)
Per patient Wellbutrin has been added to his medications but he has not started taking that yet

## 2018-11-26 ENCOUNTER — Other Ambulatory Visit: Payer: Self-pay

## 2018-11-26 ENCOUNTER — Ambulatory Visit: Payer: Managed Care, Other (non HMO) | Admitting: Pediatrics

## 2018-11-26 ENCOUNTER — Encounter: Payer: Self-pay | Admitting: Pediatrics

## 2018-11-26 VITALS — BP 130/76 | HR 99 | Temp 96.4°F | Resp 18 | Ht 73.0 in | Wt 232.6 lb

## 2018-11-26 DIAGNOSIS — E2740 Unspecified adrenocortical insufficiency: Secondary | ICD-10-CM

## 2018-11-26 DIAGNOSIS — H1010 Acute atopic conjunctivitis, unspecified eye: Secondary | ICD-10-CM | POA: Diagnosis not present

## 2018-11-26 DIAGNOSIS — K219 Gastro-esophageal reflux disease without esophagitis: Secondary | ICD-10-CM | POA: Diagnosis not present

## 2018-11-26 DIAGNOSIS — J019 Acute sinusitis, unspecified: Secondary | ICD-10-CM

## 2018-11-26 DIAGNOSIS — J301 Allergic rhinitis due to pollen: Secondary | ICD-10-CM | POA: Diagnosis not present

## 2018-11-26 MED ORDER — FLUTICASONE PROPIONATE 50 MCG/ACT NA SUSP: NASAL | 5 refills | Status: DC

## 2018-11-26 MED ORDER — AMOXICILLIN-POT CLAVULANATE 875-125 MG PO TABS: ORAL_TABLET | ORAL | 0 refills | Status: DC

## 2018-11-26 NOTE — Progress Notes (Signed)
[address] [place] [address] Dept: [phone]  New Patient Note  Patient ID: Nicholas Coleman, male    DOB: 02-19-1961  Age: 58 y.o. MRN: [id number] Date of Office Visit: 11/26/2018 Referring provider: Wilfrid Lund, PA [address] [place],  [place] [address]    Chief Complaint: Nasal Congestion  HPI Nicholas Coleman presents for evaluation of nasal congestion and postnasal drainage for about 18 months.  When the postnasal drainage is productive he develops irritation of his stomach and a cough.  The cough is worse after he eats.  The mucus is white.  If he has stomachaches as needed he uses omeprazole 40 mg once a day.  For his postnasal drainage she has used fexofenadine 180 mg once a day or loratadine 10 mg once a day without a great deal of relief  He has not had asthmatic symptoms, eczema, recurrent pneumonias , or recurrent sinus infections.  He had Bell's palsy 15 years ago and at times has  itchy eyes.  In November 2019 he was diagnosed with adrenal insufficiency and is on prednisone 5 mg once a day and Florinef 0.1 mg once a day   Review of Systems  Constitutional: Negative.   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  Partial removal of some bones in his hands  Skin: Negative.   Neurological: Negative.   Endo/Heme/Allergies:       No diabetes or thyroid disease.  Adrenal insufficiency diagnosed in November 2019  Psychiatric/Behavioral: Negative.     Outpatient Encounter Medications as of 11/26/2018  Medication Sig  . alfuzosin (UROXATRAL) 10 MG 24 hr tablet Take 10 mg by mouth at bedtime.    . Ascorbic Acid (VITAMIN C) 100 MG tablet Take 100 mg by mouth daily.  . calcium-vitamin D (OSCAL WITH D) 500-200 MG-UNIT tablet Take 1 tablet by mouth.  . co-enzyme Q-10 30 MG capsule Take 30 mg by mouth daily.  . fenoprofen (NALFON) 600 MG TABS tablet Take 600 mg by mouth.  . fludrocortisone (FLORINEF) 0.1 MG tablet Take 1 tablet (0.1 mg total) by mouth daily.  . Magnesium 100 MG TABS Take by mouth.  . Multiple Vitamin (MULTIVITAMIN) capsule Take 1 capsule by mouth daily.  . Omega-3 Fatty Acids (FISH OIL PO) Take 1 capsule by mouth daily.   Marland Kitchen omeprazole (PRILOSEC) 40 MG capsule Take 40 mg by mouth daily as needed.  . predniSONE (DELTASONE) 5 MG tablet Take 1 tablet (5 mg total) by mouth daily with breakfast.  . simvastatin (ZOCOR) 40 MG tablet Take 40 mg by mouth at bedtime.   . Vitamin E 100 units TABS Take 180 mg by mouth daily.  Marland Kitchen amoxicillin-clavulanate (AUGMENTIN) 875-125 MG tablet 1 tablet every 12 hours for infection for 10 days  . fluticasone (FLONASE) 50 MCG/ACT nasal spray 2 sprays per nostril at night  . [DISCONTINUED] fexofenadine (ALLEGRA) 180 MG tablet Take 180 mg by mouth daily.  . [DISCONTINUED] loratadine (  CLARITIN) 10 MG tablet Take 10 mg by mouth daily.  . [DISCONTINUED] oxyCODONE (OXY IR/ROXICODONE) 5 MG immediate release tablet Take 5 mg by mouth every 4 (four) hours as needed for severe pain.   No facility-administered encounter medications on file as of 11/26/2018.      Drug Allergies:  Allergies  Allergen Reactions  . Hydrocodone-Acetaminophen Anxiety and Other (See Comments)    Insomnia, "jittery"  . Oxycodone Anxiety and Other (See Comments)    Insomnia, "jittery"    Family History: [name]'s family history includes CAD in his father; Lymphoma in his mother; Stroke in his father..  Family history is negative for hayfever, sinus problems, asthma, angioedema, eczema, chronic hives, food allergies, chronic bronchitis or emphysema.  Social and environmental.   They have a cat in the home.  He is not exposed to cigarette smoking.  He has never smoked cigarettes in the past.  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  His symptoms are not worse there .  Physical Exam: BP 130/76   Pulse 99   Temp (!) 96.4 F (35.8 C) (Temporal)   Resp 18   Ht 6\' 1"  (1.854 m)   Wt 232 lb 9.6 oz (105.5 kg)   SpO2 96%   BMI 30.69 kg/m    Physical Exam Vitals signs reviewed.  Constitutional:      Appearance: Normal appearance. He is normal weight.  HENT:     Head:     Comments: Eyes normal.  Ears normal.    Nose mild swelling of the nasal turbinates.  Pharynx normal except for light yellow postnasal drainage Neck:     Musculoskeletal: Neck supple.     Comments: No thyromegaly Cardiovascular:     Rate and Rhythm: Normal rate and regular rhythm.     Comments: S1-S2 normal no murmurs Pulmonary:     Comments: Clear to percussion and auscultation Abdominal:     Palpations: Abdomen is soft.     Tenderness: There is no abdominal tenderness.     Comments: No hepatosplenomegaly  Lymphadenopathy:     Cervical: No cervical adenopathy.  Skin:    Comments: Clear  Neurological:     General: No focal deficit present.     Mental Status: He is alert and oriented to person, place, and time. Mental status is at baseline.  Psychiatric:        Mood and Affect: Mood normal.        Behavior: Behavior normal.        Thought Content: Thought content normal.        Judgment: Judgment normal.     Diagnostics: Allergy skin test were positive to tree pollens, grass pollens, some molds,.  Mild reactions to cat and dog   Assessment  Assessment and Plan: 1. Seasonal allergic rhinitis due to pollen   2. Seasonal allergic conjunctivitis   3. Adrenal insufficiency ([place])   4. Gastroesophageal reflux disease without esophagitis   5. Acute non-recurrent sinusitis, unspecified location     Meds ordered this encounter  Medications  . fluticasone (FLONASE) 50  MCG/ACT nasal spray    Sig: 2 sprays per nostril at night    Dispense:  18.2 mL    Refill:  5  . amoxicillin-clavulanate (AUGMENTIN) 875-125 MG tablet    Sig: 1 tablet every 12 hours for infection for 10 days    Dispense:  20 tablet    Refill:  0    Patient Instructions  Environmental control of dust  and mold Zyrtec 10 mg-take 1 tablet once a day if needed for runny nose or itchy eye , instead of loratadine or Allegra Nasal saline irrigations at night followed by fluticasone 2 sprays per nostril at night Zaditor 0.025% eyedrops-1 drop 3 times a day if needed for itchy eyes Omeprazole 40 mg-take 1 capsule once a day for acid reflux Add prednisone 10 mg twice a day for 4 days, 10 mg on the fifth day then go back to 5 mg daily for your Addison's disease. Augmentin 875 mg-take 1 tablet every 12 hours for infection for 10 days    Continue on  your other medications Call us if you are not doing well on this treatment plan Get a Medic Alert noting that you have Addison's disease   Return in about 4 weeks (around 12/24/2018).   Thank you for the opportunity to care for this patient.  Please do not hesitate to contact me with questions.  Tonette BihariJ. A. Bardelas, M.D.  [facility] [address] [place], [place] [address] [phone]

## 2018-11-26 NOTE — Patient Instructions (Addendum)
Environmental control of dust and mold Zyrtec 10 mg-take 1 tablet once a day if needed for runny nose or itchy eye , instead of loratadine or Allegra Nasal saline irrigations at night followed by fluticasone 2 sprays per nostril at night Zaditor 0.025% eyedrops-1 drop 3 times a day if needed for itchy eyes Omeprazole 40 mg-take 1 capsule once a day for acid reflux Add prednisone 10 mg twice a day for 4 days, 10 mg on the fifth day then go back to 5 mg daily for your Addison's disease. Augmentin 875 mg-take 1 tablet every 12 hours for infection for 10 days    Continue on  your other medications Call us if you are not doing well on this treatment plan Get a Medic Alert noting that you have Addison's disease

## 2018-12-05 NOTE — Progress Notes (Signed)
Name: Nicholas PostinWilliam Coleman Coleman  MRN/ DOB: [id number], 1960/09/23    Age/ Sex: 58 y.o., male     PCP: Wilfrid LundBecker, Anna G, PA   Reason for Endocrinology Evaluation: Adrenal insufficiency     Initial Endocrinology Clinic Visit: 03/11/2018    PATIENT IDENTIFIER: Nicholas Coleman Nicholas Coleman is Coleman 58 y.o., male with Coleman past medical history of HTN, Dyslipidemia and OSA. He has followed with [facility] since 03/12/2019 for consultative assistance with management of his adrenal insufficiency.   HISTORICAL SUMMARY: The patient was first diagnosed with primary adrenal insufficiency in 01/2018 after presenting to the ED with hypotension, nausea and lightheadedness. IN the ED, he was noted with hyponatremia with Coleman serum sodium Nadir of 117 mEq/L and high normal K= of 5.0 mEq/L .  Coleman cosyntropin Stimulation test was abnormal 60- minute cortisol level of 3.9 ug/dL, with Coleman high ACTH  Level 363.0 pg/mL   He was discharged on HC 15 mg QAM and 10 mg QPM and florinef.  No FH of thyroid  Or autoimmune disease, that he is aware of.    SUBJECTIVE:   During last visit (06/10/2018): We switched HC to prednisone 5 mg with breakfast and increased florinef to 0.1 mg daily   Today (12/09/2018):  Mr. Beulah GandyRudolph is here for Coleman 6 month follow up on primary adrenal insufficiency. Somehow he did not increase his Florinef as advised on his last visit in March, 2020 He is compliant with prednisone dose . He is c/o dizziness, mainly positional  He has cough and occasional abdominal pain that he attributes to allergies.       Endocrine Medications  Prednisone 5 mg daily  Fludrocortisone 0.1 mg daily     ROS:  As per HPI.   HISTORY:  Past Medical History:  Past Medical History:  Diagnosis Date  . AKI (acute kidney injury) (HCC) 02/05/2018  . Atypical chest pain 12/11/2017  . BPH (benign prostatic hyperplasia) 07/12/2015  . Essential hypertension 07/12/2015  . Exertional dyspnea 12/11/2017  . Hyperlipidemia   .  Hypertension     Past Surgical History: No past surgical history on file.  Social History:  reports that he has never smoked. He has never used smokeless tobacco. He reports that he does not drink alcohol or use drugs. Family History:  Family History  Problem Relation Age of Onset  . Lymphoma Mother   . Stroke Father   . CAD Father   . Angioedema Neg Hx   . Allergic rhinitis Neg Hx   . Asthma Neg Hx   . Eczema Neg Hx   . Immunodeficiency Neg Hx   . Urticaria Neg Hx      HOME MEDICATIONS: Allergies as of 12/09/2018      Reactions   Hydrocodone-acetaminophen Anxiety, Other (See Comments)   Insomnia, "jittery"   Oxycodone Anxiety, Other (See Comments)   Insomnia, "jittery"      Medication List       Accurate as of December 09, 2018  9:03 AM. If you have any questions, ask your nurse or doctor.        STOP taking these medications   amoxicillin-clavulanate 875-125 MG tablet Commonly known as: Augmentin Stopped by: Scarlette ShortsIbtehal J Zalmen Wrightsman, MD     TAKE these medications   alfuzosin 10 MG 24 hr tablet Commonly known as: UROXATRAL Take 10 mg by mouth at bedtime.   calcium-vitamin D 500-200 MG-UNIT tablet Commonly known as: OSCAL WITH D Take 1 tablet by mouth.  co-enzyme Q-10 30 MG capsule Take 30 mg by mouth daily.   fenoprofen 600 MG Tabs tablet Commonly known as: NALFON Take 600 mg by mouth.   FISH OIL PO Take 1 capsule by mouth daily.   fludrocortisone 0.1 MG tablet Commonly known as: FLORINEF Take 1 tablet (0.1 mg total) by mouth daily.   fluticasone 50 MCG/ACT nasal spray Commonly known as: FLONASE 2 sprays per nostril at night   Magnesium 100 MG Tabs Take by mouth.   multivitamin capsule Take 1 capsule by mouth daily.   omeprazole 40 MG capsule Commonly known as: PRILOSEC Take 40 mg by mouth daily as needed.   predniSONE 5 MG tablet Commonly known as: DELTASONE Take 1 tablet (5 mg total) by mouth daily with breakfast.   simvastatin 40 MG  tablet Commonly known as: ZOCOR Take 40 mg by mouth at bedtime.   vitamin C 100 MG tablet Take 100 mg by mouth daily.   Vitamin E 100 units Tabs Take 180 mg by mouth daily.         OBJECTIVE:   PHYSICAL EXAM: VS: BP 114/64 (BP Location: Left Arm, Patient Position: Sitting, Cuff Size: Large)   Pulse 80   Temp 98.7 F (37.1 C)   Ht 6\' 1"  (1.854 m)   Wt 243 lb 6.4 oz (110.4 kg)   SpO2 97%   BMI 32.11 kg/m    Body surface area is 2.38 meters squared.   EXAM: General: Pt appears well and is in NAD  Ears, Nose, Throat: Hearing: Grossly intact bilaterally Dental: Good dentition  Throat: Clear without mass, erythema or exudate  Neck: General: Supple without adenopathy. Thyroid: Thyroid size normal.  No goiter or nodules appreciated. No thyroid bruit.  Lungs: Clear with good BS bilat with no rales, rhonchi, or wheezes  Heart: Auscultation: RRR.  Abdomen: Normoactive bowel sounds, soft, nontender, without masses or organomegaly palpable  Extremities:  BL LE: No pretibial edema normal ROM and strength.  Skin: Hair: Texture and amount normal with gender appropriate distribution Skin Inspection: No rashes.Pt with tan color on exposed area, no hyperpigmentation of the buccal mucosa or palms.  Skin Palpation: Skin temperature, texture, and thickness normal to palpation  Neuro: Cranial nerves: II - XII grossly intact  Motor: Normal strength throughout DTRs: 2+ and symmetric in UE without delay in relaxation phase  Mental Status: Judgment, insight: Intact Memory: Intact for recent and remote events Mood and affect: No depression, anxiety, or agitation     DATA REVIEWED:  Results for SY, SAINTJEAN (MRN Nicholas Coleman) as of 06/10/2018 12:41  Ref. Range 02/06/2018 06:28  Cortisol, Base Latest Units: ug/dL 3.8  Cortisol, 30 Min Latest Units: ug/dL 4.2  Cortisol, 60 Min Latest Units: ug/dL 3.9   Results for SUN, WILENSKY (MRN Nicholas Coleman) as of 06/10/2018 12:41  Ref. Range  02/06/2018 16:05 02/07/2018 07:00 03/11/2018 09:18  C206 ACTH Latest Ref Range: 6 - 50 pg/mL 363.0 (H) 423.0 (H) 839 (H)   Results for JAMONI, HEWES (MRN Nicholas Coleman) as of 12/10/2018 11:32  Ref. Range 12/09/2018 09:15  Sodium Latest Ref Range: 135 - 145 mEq/L 131 (L)  Potassium Latest Ref Range: 3.5 - 5.1 mEq/L 4.8  Chloride Latest Ref Range: 96 - 112 mEq/L 97  CO2 Latest Ref Range: 19 - 32 mEq/L 25  Glucose Latest Ref Range: 70 - 99 mg/dL 98  BUN Latest Ref Range: 6 - 23 mg/dL 42 (H)  Creatinine Latest Ref Range: 0.40 - 1.50 mg/dL 12/11/2018  Calcium Latest Ref Range: 8.4 - 10.5 mg/dL 9.8  Alkaline Phosphatase Latest Ref Range: 39 - 117 U/L 43  Albumin Latest Ref Range: 3.5 - 5.2 g/dL 4.4  AST Latest Ref Range: 0 - 37 U/L 17  ALT Latest Ref Range: 0 - 53 U/L 29  Total Protein Latest Ref Range: 6.0 - 8.3 g/dL 7.1  Total Bilirubin Latest Ref Range: 0.2 - 1.2 mg/dL 0.4  GFR Latest Ref Range: >60.00 mL/min 62.33  VITD Latest Ref Range: 30.00 - 100.00 ng/mL 44.79  WBC Latest Ref Range: 4.0 - 10.5 K/uL 8.9  RBC Latest Ref Range: 4.22 - 5.81 Mil/uL 4.63  Hemoglobin Latest Ref Range: 13.0 - 17.0 g/dL 14.3  HCT Latest Ref Range: 39.0 - 52.0 % 41.2  MCV Latest Ref Range: 78.0 - 100.0 fl 89.1  MCHC Latest Ref Range: 30.0 - 36.0 g/dL 34.8  RDW Latest Ref Range: 11.5 - 15.5 % 13.8  Platelets Latest Ref Range: 150.0 - 400.0 K/uL 175.0  Neutrophils Latest Ref Range: 43.0 - 77.0 % 63.7  Lymphocytes Latest Ref Range: 12.0 - 46.0 % 24.1  Monocytes Relative Latest Ref Range: 3.0 - 12.0 % 8.1  Eosinophil Latest Ref Range: 0.0 - 5.0 % 3.4  Basophil Latest Ref Range: 0.0 - 3.0 % 0.7  NEUT# Latest Ref Range: 1.4 - 7.7 K/uL 5.6  Lymphocyte # Latest Ref Range: 0.7 - 4.0 K/uL 2.1  Monocyte # Latest Ref Range: 0.1 - 1.0 K/uL 0.7  Eosinophils Absolute Latest Ref Range: 0.0 - 0.7 K/uL 0.3  Basophils Absolute Latest Ref Range: 0.0 - 0.1 K/uL 0.1  TSH Latest Ref Range: 0.35 - 4.50 uIU/mL 2.66  T4,Free(Direct)  Latest Ref Range: 0.60 - 1.60 ng/dL 0.80   ASSESSMENT / PLAN / RECOMMENDATIONS:   1. Primary Adrenal Insufficiency :  - Unclear cause at this time, iron studies were unremarkable, CT scan was normal around the time of his diagnosis. He was on this high fat diet at the time.  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 Pt never increased his fludrocortisone as advised back in March.   - Repeat BMP today continues to show low Na and high-normal potassium, pt was advised to increase to Coleman one full tablet and to contact us in 2 weeks if no improvement in symptoms.  - He was also advised that during the hot summer days he will have to increase the fludrocortisone .  - We again reviewed the sick day rule.    Medications   Prednisone 5 mg daily with Breakfast   Florinef to 0.1 mg daily     F/u in 3 months  Addendum: Discussed lab results with the patient on 9/29 at 10 AM     Signed electronically by: Mack Guise, MD  [facility] End[facility]  [address] [place], [place] [address] Phone: [phone] FAX: [phone]      CC: [address], [place] [address] Phone: [phone]  Fax: [phone]   Return to Endocrinology clinic as below: Future Appointments  Date Time Provider [place]  12/09/2018  9:10 AM Latreece Mochizuki, [name] Crazier, MD [facility] None  12/30/2018 10:45 AM Charlies Silvers, MD [facility] None

## 2018-12-09 ENCOUNTER — Ambulatory Visit (INDEPENDENT_AMBULATORY_CARE_PROVIDER_SITE_OTHER): Payer: Managed Care, Other (non HMO) | Admitting: Internal Medicine

## 2018-12-09 ENCOUNTER — Encounter: Payer: Self-pay | Admitting: Internal Medicine

## 2018-12-09 ENCOUNTER — Other Ambulatory Visit: Payer: Self-pay

## 2018-12-09 VITALS — BP 86/60 | HR 80 | Temp 98.7°F | Ht 73.0 in | Wt 243.4 lb

## 2018-12-09 DIAGNOSIS — I951 Orthostatic hypotension: Secondary | ICD-10-CM | POA: Diagnosis not present

## 2018-12-09 LAB — COMPREHENSIVE METABOLIC PANEL
ALT: 29 U/L (ref 0–53)
AST: 17 U/L (ref 0–37)
Albumin: 4.4 g/dL (ref 3.5–5.2)
Alkaline Phosphatase: 43 U/L (ref 39–117)
BUN: 42 mg/dL — ABNORMAL HIGH (ref 6–23)
CO2: 25 mEq/L (ref 19–32)
Calcium: 9.8 mg/dL (ref 8.4–10.5)
Chloride: 97 mEq/L (ref 96–112)
Creatinine, Ser: 1.19 mg/dL (ref 0.40–1.50)
GFR: 62.33 mL/min (ref >60.00)
Glucose, Bld: 98 mg/dL (ref 70–99)
Potassium: 4.8 mEq/L (ref 3.5–5.1)
Sodium: 131 mEq/L — ABNORMAL LOW (ref 135–145)
Total Bilirubin: 0.4 mg/dL (ref 0.2–1.2)
Total Protein: 7.1 g/dL (ref 6.0–8.3)

## 2018-12-09 LAB — CBC WITH DIFFERENTIAL/PLATELET
Basophils Absolute: 0.1 10*3/uL (ref 0.0–0.1)
Basophils Relative: 0.7 % (ref 0.0–3.0)
Eosinophils Absolute: 0.3 10*3/uL (ref 0.0–0.7)
Eosinophils Relative: 3.4 % (ref 0.0–5.0)
HCT: 41.2 % (ref 39.0–52.0)
Hemoglobin: 14.3 g/dL (ref 13.0–17.0)
Lymphocytes Relative: 24.1 % (ref 12.0–46.0)
Lymphs Abs: 2.1 10*3/uL (ref 0.7–4.0)
MCHC: 34.8 g/dL (ref 30.0–36.0)
MCV: 89.1 fl (ref 78.0–100.0)
Monocytes Absolute: 0.7 10*3/uL (ref 0.1–1.0)
Monocytes Relative: 8.1 % (ref 3.0–12.0)
Neutro Abs: 5.6 10*3/uL (ref 1.4–7.7)
Neutrophils Relative %: 63.7 % (ref 43.0–77.0)
Platelets: 175 10*3/uL (ref 150.0–400.0)
RBC: 4.63 Mil/uL (ref 4.22–5.81)
RDW: 13.8 % (ref 11.5–15.5)
WBC: 8.9 10*3/uL (ref 4.0–10.5)

## 2018-12-09 LAB — VITAMIN D 25 HYDROXY (VIT D DEFICIENCY, FRACTURES): VITD: 44.79 ng/mL (ref 30.00–100.00)

## 2018-12-09 LAB — T4, FREE: Free T4: 0.8 ng/dL (ref 0.60–1.60)

## 2018-12-09 LAB — TSH: TSH: 2.66 u[IU]/mL (ref 0.35–4.50)

## 2018-12-09 NOTE — Patient Instructions (Signed)
-  Continue Prednisone 5 mg daily  - Continue Florinef 0.1 mg daily     ADRENAL INSUFFICIENCY SICK DAY RULES:  Should you face an extreme emotional or physical stress such as trauma, surgery or acute illness, this will require extra steroid coverage so that the body can meet that stress.   Without increasing the steroid dose you may experience severe weakness, headache, dizziness, nausea and vomiting and possibly a more serious deterioration in health.  Typically the dose of steroids will only need to be increased for a couple of days if you have an illness that is transient and managed in the community.   If you are unable to take/absorb an increased dose of steroids orally because of vomiting or diarrhea, you will urgently require steroid injections and should present to an Emergency Department.  The general advice for any serious illness is as follows: 1. Double the normal daily steroid dose for up to 3 days if you have a temperature of more than 37.50C (99.36F) with signs of sickness, or severe emotional or physical distress 2. Contact your primary care doctor and Endocrinologist if the illness worsens or it lasts for more than 3 days.  3. In cases of severe illness, urgent medical assistance should be promptly sought. 4. If you experience vomiting/diarrhea or are unable to take steroids by mouth, please administer the Hydrocortisone injection kit and seek urgent medical help.

## 2018-12-10 MED ORDER — FLUDROCORTISONE ACETATE 0.1 MG PO TABS: 0.1000 mg | ORAL_TABLET | Freq: Every day | ORAL | 3 refills | Status: DC

## 2018-12-10 NOTE — Addendum Note (Signed)
Addended by: Dorita Sciara on: 12/10/2018 11:33 AM   Modules accepted: Orders

## 2018-12-30 ENCOUNTER — Ambulatory Visit: Payer: Managed Care, Other (non HMO) | Admitting: Pediatrics

## 2018-12-30 ENCOUNTER — Encounter: Payer: Self-pay | Admitting: Pediatrics

## 2018-12-30 ENCOUNTER — Other Ambulatory Visit: Payer: Self-pay

## 2018-12-30 VITALS — BP 122/76 | HR 77 | Temp 98.3°F | Resp 16

## 2018-12-30 DIAGNOSIS — J301 Allergic rhinitis due to pollen: Secondary | ICD-10-CM | POA: Diagnosis not present

## 2018-12-30 DIAGNOSIS — E2740 Unspecified adrenocortical insufficiency: Secondary | ICD-10-CM

## 2018-12-30 DIAGNOSIS — J4530 Mild persistent asthma, uncomplicated: Secondary | ICD-10-CM | POA: Diagnosis not present

## 2018-12-30 DIAGNOSIS — K219 Gastro-esophageal reflux disease without esophagitis: Secondary | ICD-10-CM | POA: Diagnosis not present

## 2018-12-30 DIAGNOSIS — Z23 Encounter for immunization: Secondary | ICD-10-CM | POA: Diagnosis not present

## 2018-12-30 MED ORDER — ALBUTEROL SULFATE HFA 108 (90 BASE) MCG/ACT IN AERS: 2.0000 | INHALATION_SPRAY | RESPIRATORY_TRACT | 1 refills | Status: DC | PRN

## 2018-12-30 MED ORDER — MONTELUKAST SODIUM 10 MG PO TABS: ORAL_TABLET | ORAL | 5 refills | Status: DC

## 2018-12-30 NOTE — Patient Instructions (Addendum)
Zyrtec 10 mg-take 1 tablet once a day for runny nose for itchy eyes Nasal saline irrigations at night followed by fluticasone 2 sprays per nostril at night Zaditor 0.025% eyedrops-1 drop 3 times a day if needed for itchy eyes Call me with the name of your nose spray that you are using in the morning .  If it is a  decongestant nasal spray you need to stop it.  Omeprazole 40 mg-take 1 capsule once a day for acid reflux  Montelukast 10 mg-take 1 tablet at night to prevent coughing or wheezing Proair 2 puffs every 4 hours if needed for wheezing or coughing spells .  You may use Proair 2 puffs 5 to 15 minutes before exercise  Continue on your other medications Call us if you are not doing well on this treatment plan

## 2018-12-30 NOTE — Progress Notes (Signed)
[place] [address] Dept: [phone]  FOLLOW UP NOTE  Patient ID: Nicholas Coleman, male    DOB: 1960/07/09  Age: 58 y.o. MRN: [id number] Date of Office Visit: 12/30/2018  Assessment  Chief Complaint: Allergic Rhinitis   HPI Nicholas Coleman presents for follow-up of allergic rhinitis.  He has been having a cough and occasionally has noted some wheezing.  In general his nasal symptoms have improved.  He is using an over-the-counter nasal spray in the morning  His postnasal drainage has improved.  He is not having gastroesophageal reflux.Marland Kitchen He is doing nasal saline irrigations at night followed by fluticasone 2 sprays per nostril at night and taking Zyrtec 10 mg in the morning  Other current medications are outlined in the chart   Drug Allergies:  Allergies  Allergen Reactions   Hydrocodone-Acetaminophen Anxiety and Other (See Comments)    Insomnia, "jittery"   Oxycodone Anxiety and Other (See Comments)    Insomnia, "jittery"    Physical Exam: BP 122/76    Pulse 77    Temp 98.3 F (36.8 C) (Oral)    Resp 16    SpO2 97%    Physical Exam Vitals signs reviewed.  Constitutional:      Appearance: Normal appearance. He is normal weight.  HENT:     Head:     Comments: Eyes normal.  Ears normal.  Nose normal.  Pharynx normal. Neck:     Musculoskeletal: Neck supple.  Cardiovascular:     Comments: S1-S2 normal no murmurs Pulmonary:     Comments: Clear to percussion and auscultation Lymphadenopathy:     Cervical: No cervical adenopathy.  Neurological:     General: No focal deficit present.     Mental Status: He is alert and oriented to person, place, and time. Mental status is at baseline.  Psychiatric:        Mood and Affect: Mood normal.        Behavior: Behavior normal.        Thought Content: Thought content normal.        Judgment: Judgment normal.     Diagnostics: FVC 4.35 L FEV1 3.32 L.  Predicted FVC 5.26 L predicted FEV1 3.98 L.  After  albuterol 2 puffs FVC 4.48 L FEV1 3.43 L-the spirometry is in the normal range and there was no significant improvement after albuterol  Assessment and Plan: 1. Need for prophylactic vaccination and inoculation against influenza   2. Mild persistent reactive airway disease without complication   3. Adrenal insufficiency ([place])   4. Seasonal allergic rhinitis due to pollen   5. Gastroesophageal reflux disease without esophagitis     Meds ordered this encounter  Medications   montelukast (SINGULAIR) 10 MG tablet    Sig: Take 1 tablet at night for coughing or wheezing.    Dispense:  34 tablet    Refill:  5   albuterol (PROAIR HFA) 108 (90 Base) MCG/ACT inhaler    Sig: Inhale 2 puffs into the lungs every 4 (four) hours as needed for wheezing or shortness of breath.    Dispense:  18 g    Refill:  1    Patient Instructions  Zyrtec 10 mg-take 1 tablet once a day for runny nose for itchy eyes Nasal saline irrigations at night followed by fluticasone 2 sprays per nostril at night Zaditor 0.025% eyedrops-1 drop 3 times a day if needed for itchy eyes Call me with the name of your nose spray that you  are using in the morning .  If it is a  decongestant nasal spray you need to stop it.  Omeprazole 40 mg-take 1 capsule once a day for acid reflux  Montelukast 10 mg-take 1 tablet at night to prevent coughing or wheezing Proair 2 puffs every 4 hours if needed for wheezing or coughing spells .  You may use Proair 2 puffs 5 to 15 minutes before exercise  Continue on your other medications Call us if you are not doing well on this treatment plan   Return in about 4 weeks (around 01/27/2019).    Thank you for the opportunity to care for this patient.  Please do not hesitate to contact me with questions.  Tonette Bihari, M.D.  [facility] [address] [place], [place] [address] [phone]

## 2019-01-06 ENCOUNTER — Telehealth: Payer: Self-pay | Admitting: *Deleted

## 2019-01-06 NOTE — Telephone Encounter (Signed)
Pt called and said his coughing is not getting better and that he is using inhaler as he was told. He is using Proair every 4 hours and using all other prescribed meds as stated from last OV 12/30/18. Please advise.

## 2019-01-06 NOTE — Telephone Encounter (Signed)
Call the patient and find out if he has any fever or discolored mucus.  If he does not , he  should continue on his current medications but add prednisone 10 mg tablet to take 2 tablets twice a day for 3 days, 2 tablets on the fourth day, 1 tablet on the fifth day and then resume his medications for Addison's disease.  He is to call us if he does not improve on this treatment plan

## 2019-01-07 MED ORDER — PREDNISONE 5 MG PO TABS: ORAL_TABLET | ORAL | 0 refills | Status: DC

## 2019-01-07 NOTE — Telephone Encounter (Signed)
LVM to return call.

## 2019-01-07 NOTE — Telephone Encounter (Signed)
Pt thinks that the Proair was causing the problems because he states that he stopped it and now it is getting better. I told him it could just be a coincidence and that usually the only common side effects from albuterol is tachycardia and jitteriness. He said that the more he used the inhaler the wetter and deeper the cough got. He states he does not want to use this any longer.

## 2019-01-08 ENCOUNTER — Other Ambulatory Visit: Payer: Self-pay

## 2019-01-08 MED ORDER — ALBUTEROL SULFATE HFA 108 (90 BASE) MCG/ACT IN AERS: 2.0000 | INHALATION_SPRAY | RESPIRATORY_TRACT | 1 refills | Status: DC | PRN

## 2019-01-08 NOTE — Telephone Encounter (Signed)
Informed pt of the change and sent in rx 

## 2019-01-08 NOTE — Telephone Encounter (Signed)
Call patient.  Tell him that we can call in Ventolin 2 puffs every 4 hours if needed instead of Proair for coughing or wheezing instead of Proair.  If Ventolin is not preferred by his insurance, switch to Proventil

## 2019-01-28 ENCOUNTER — Ambulatory Visit: Payer: Managed Care, Other (non HMO) | Admitting: Pediatrics

## 2019-02-25 ENCOUNTER — Other Ambulatory Visit: Payer: Self-pay

## 2019-02-25 ENCOUNTER — Encounter: Payer: Self-pay | Admitting: Pediatrics

## 2019-02-25 ENCOUNTER — Ambulatory Visit (INDEPENDENT_AMBULATORY_CARE_PROVIDER_SITE_OTHER): Payer: Managed Care, Other (non HMO) | Admitting: Family Medicine

## 2019-02-25 ENCOUNTER — Ambulatory Visit: Payer: Managed Care, Other (non HMO) | Admitting: Pediatrics

## 2019-02-25 VITALS — BP 142/82 | HR 78 | Temp 97.1°F | Resp 18

## 2019-02-25 DIAGNOSIS — J4530 Mild persistent asthma, uncomplicated: Secondary | ICD-10-CM

## 2019-02-25 DIAGNOSIS — Z713 Dietary counseling and surveillance: Secondary | ICD-10-CM

## 2019-02-25 DIAGNOSIS — I129 Hypertensive chronic kidney disease with stage 1 through stage 4 chronic kidney disease, or unspecified chronic kidney disease: Secondary | ICD-10-CM | POA: Diagnosis not present

## 2019-02-25 DIAGNOSIS — K219 Gastro-esophageal reflux disease without esophagitis: Secondary | ICD-10-CM

## 2019-02-25 DIAGNOSIS — I10 Essential (primary) hypertension: Secondary | ICD-10-CM

## 2019-02-25 DIAGNOSIS — N183 Chronic kidney disease, stage 3 unspecified: Secondary | ICD-10-CM

## 2019-02-25 DIAGNOSIS — H1010 Acute atopic conjunctivitis, unspecified eye: Secondary | ICD-10-CM | POA: Diagnosis not present

## 2019-02-25 DIAGNOSIS — J301 Allergic rhinitis due to pollen: Secondary | ICD-10-CM | POA: Diagnosis not present

## 2019-02-25 DIAGNOSIS — Z7952 Long term (current) use of systemic steroids: Secondary | ICD-10-CM | POA: Diagnosis not present

## 2019-02-25 DIAGNOSIS — E669 Obesity, unspecified: Secondary | ICD-10-CM

## 2019-02-25 MED ORDER — OMEPRAZOLE 40 MG PO CPDR: 40.0000 mg | DELAYED_RELEASE_CAPSULE | Freq: Every day | ORAL | 5 refills | Status: DC | PRN

## 2019-02-25 NOTE — Progress Notes (Signed)
[address] [place] [address] Dept: [phone]  FOLLOW UP NOTE  Patient ID: Nicholas Coleman, male    DOB: 1960-06-25  Age: 58 y.o. MRN: [id number] Date of Office Visit: 02/25/2019  Assessment  Chief Complaint: Asthma  HPI Nicholas Coleman is a 58 year old male who presents to the clinic for a follow up visit. At today's visit, he reports allergic rhinitis continues to be poorly controlled with thick post nasal drainage, cough producing thick clear mucus mostly occurring in the mornings, and occasional nasal congestion. He is currently taking cetirizine 10 mg once a day, using saline nasal rinses once a day followed by Flonase nasal spray. He demonstrates poor nasal spray application technique in the clinic. He reports that he has a cat that sleeps near his head and likes to lie in his lap when he is at home. Asthma is reported as well controlled with no shortness of breath or wheeze. He continues montelukast 10 mg once a day and has not needed to use his albuterol since his last visit to this clinic. He reports reflux as not well controlled with heartburn occurring on an average of a few times a month. Aggravating factors include citrus fruits and juices and some red sauces. He continues omeprazole 40 mg once a day. His current medications are listed in the chart.    Drug Allergies:  Allergies  Allergen Reactions  . Hydrocodone-Acetaminophen Anxiety and Other (See Comments)    Insomnia, "jittery"  . Oxycodone Anxiety and Other (See Comments)    Insomnia, "jittery"    Physical Exam: BP (!) 142/82   Pulse 78   Temp (!) 97.1 F (36.2 C) (Temporal)   Resp 18    Physical Exam Vitals reviewed.  Constitutional:      Appearance: Normal appearance.  HENT:     Head: Normocephalic and atraumatic.     Right Ear: Tympanic membrane normal.     Left Ear: Tympanic membrane normal.     Nose:     Comments: Bilateral nares slightly erythematous with clear nasal drainage noted.  Pharynx erythematous with no exudate noted. Ears normal. Eyes normal. Eyes:     Conjunctiva/sclera: Conjunctivae normal.  Cardiovascular:     Rate and Rhythm: Normal rate and regular rhythm.     Heart sounds: Normal heart sounds. No murmur.  Pulmonary:     Effort: Pulmonary effort is normal.     Breath sounds: Normal breath sounds.     Comments: Lungs clear to auscultation Musculoskeletal:        General: Normal range of motion.     Cervical back: Normal range of motion and neck supple.  Skin:    General: Skin is warm and dry.  Neurological:     Mental Status: He is alert and oriented to person, place, and time.  Psychiatric:        Mood and Affect: Mood normal.        Behavior: Behavior normal.        Thought Content: Thought content normal.        Judgment: Judgment normal.     Diagnostics: FVC 4.19, FEV1 3.42. Predicted FVC 5.26, FEV1 3.98. Spirometry indicates normal ventilatory function.   Assessment and Plan: 1. Seasonal allergic rhinitis due to pollen   2. Mild persistent reactive airway disease without complication   3. Seasonal allergic conjunctivitis   4. Gastroesophageal reflux disease without esophagitis     Meds ordered this encounter  Medications  . omeprazole (PRILOSEC)  40 MG capsule    Sig: Take 1 capsule (40 mg total) by mouth daily as needed.    Dispense:  30 capsule    Refill:  5    Patient Instructions  Allergic rhinitis Continue Zyrtec 10 mg-take 1 tablet once a day for runny nose for itchy eyes Continue nasal saline irrigations at night followed by fluticasone 2 sprays per nostril at night Continue fluticasone 2 sprays in each nostril once a day.  In the right nostril, point the applicator out toward the right ear. In the left nostril, point the applicator out toward the left ear For thick post nasal drainage, begin Mucinex [phone] mg twice a day and increase hydration as tolerated For nighttime drainage, you can take Benadryl 25 mg as  needed  Allergic conjunctivitis Continue Zaditor 0.025% eyedrops-1 drop 3 times a day if needed for itchy eyes  Reflux Continue omeprazole 40 mg-take 1 capsule once a day for acid reflux Continue dietary and lifestyle modifications as listed below  Asthma Continue montelukast 10 mg-take 1 tablet at night to prevent coughing or wheezing.  ProAir 2 puffs every 4 hours if needed for wheezing or coughing spells .  You may use ProAir 2 puffs 5 to 15 minutes before exercise  Continue on your other medications  Call us if you are not doing well on this treatment plan  Follow up in 4 months or sooner if needed   Return in about 4 months (around 06/26/2019), or if symptoms worsen or fail to improve.   Thank you for the opportunity to care for this patient.  Please do not hesitate to contact me with questions.  Gareth Morgan, FNP [facility] I have provided oversight concerning Gareth Morgan' evaluation and treatment of this patient's health issues addressed during today's encounter. I agree with the assessment and therapeutic plan as outlined in the note.   Thank you for the opportunity to care for this patient.  Please do not hesitate to contact me with questions.  Penne Lash, M.D.  [facility] [address] [place], [place] [address] [phone]

## 2019-02-25 NOTE — Patient Instructions (Addendum)
Allergic rhinitis Continue Zyrtec 10 mg-take 1 tablet once a day for runny nose for itchy eyes Continue nasal saline irrigations at night followed by fluticasone 2 sprays per nostril at night Continue fluticasone 2 sprays in each nostril once a day.  In the right nostril, point the applicator out toward the right ear. In the left nostril, point the applicator out toward the left ear For thick post nasal drainage, begin Mucinex [phone] mg twice a day and increase hydration as tolerated For nighttime drainage, you can take Benadryl 25 mg as needed  Allergic conjunctivitis Continue Zaditor 0.025% eyedrops-1 drop 3 times a day if needed for itchy eyes  Reflux Continue omeprazole 40 mg-take 1 capsule once a day for acid reflux Continue dietary and lifestyle modifications as listed below  Asthma Continue montelukast 10 mg-take 1 tablet at night to prevent coughing or wheezing.  ProAir 2 puffs every 4 hours if needed for wheezing or coughing spells .  You may use ProAir 2 puffs 5 to 15 minutes before exercise  Continue on your other medications  Call us if you are not doing well on this treatment plan  Follow up in 4 months or sooner if needed   Lifestyle Changes for Controlling GERD When you have GERD, stomach acid feels as if it's backing up toward your mouth. Whether or not you take medication to control your GERD, your symptoms can often be improved with lifestyle changes.   Raise Your Head  Reflux is more likely to strike when you're lying down flat, because stomach fluid can  flow backward more easily. Raising the head of your bed 4-6 inches can help. To do this:  Slide blocks or books under the legs at the head of your bed. Or, place a wedge under  the mattress. Many foam stores can make a suitable wedge for you. The wedge  should run from your waist to the top of your head.  Don't just prop your head on several pillows. This increases pressure on your  stomach. It can  make GERD worse.  Watch Your Eating Habits Certain foods may increase the acid in your stomach or relax the lower esophageal sphincter, making GERD more likely. It's best to avoid the following:  Coffee, tea, and carbonated drinks (with and without caffeine)  Fatty, fried, or spicy food  Mint, chocolate, onions, and tomatoes  Any other foods that seem to irritate your stomach or cause you pain  Relieve the Pressure  Eat smaller meals, even if you have to eat more often.  Don't lie down right after you eat. Wait a few hours for your stomach to empty.  Avoid tight belts and tight-fitting clothes.  Lose excess weight.  Tobacco and Alcohol  Avoid smoking tobacco and drinking alcohol. They can make GERD symptoms worse.

## 2019-02-25 NOTE — Patient Instructions (Addendum)
-   If you have questions about supplements, you might want to call Jalene Mullet at [facility] [employer]: [phone].    - Use the Meal Planning form provided to you today to design at least 7 dinner meals that meet your nutritional needs and are relatively easy and quick to make.  Use this list of meals as a basis for shopping so you can these meals any time.    - For the next few weeks, do your best to limit night-time eating to once a night only.  Track your night snacks the next day: how many times you ate, and what you had.     1. Eat at least 3 REAL meals and 1-2 snacks per day.  Aim for no more than 5 hours between eating.  Eat breakfast within one hour of getting up.  A REAL meal includes at least some protein, some starch, and vegetables and/or fruit.   OR: Would you even this to a guest in your home and call it a meal?    2. Because you are taking prednisone, you will benefit by greater care in limiting carbohydrates.  I recommend that you limit starchy foods (bread, rice, potatoes, cereal, corn, all baked goods, pasta) to 1 to 2 servings per meal.   One serving = any of the following:   - 1 slice of bread or its equivalent  - 1/2 cup of a scoopable carb  - 15 grams of Total Carbohydrate on the nutrition facts panel  - 4 fluid oz of any sweet drink (or ~8 oz hard cider) You may want to do an online search for low-glycemic carb foods.    3. Obtain twice the volume of vegetables as either protein or starchy foods for both lunch and dinner.    Follow-up:  We will address night snacking and physical activity (Dec 29 at 1:30 using [employer] platform).

## 2019-02-25 NOTE — Progress Notes (Signed)
PCP Nicholas Drivers, PA  Wife Nicholas Coleman Encounter I connected with Nicholas Coleman (MRN [id number]) on 02/25/2019 by MyChart video-enabled, HIPAA-compliant telemedicine application, verified that I am speaking with the correct person using two identifiers, and that the patient was in a private environment conducive to confidentiality.  The patient agreed to proceed.  Provider was Nicholas Center, PhD, RD, LDN, CEDRD Provider(s) located at Nicholas Coleman during this telehealth encounter; patient and wife Nicholas Coleman were at home  Appt start time: 1330 end time: 1430 (1 hour)  Reason for telehealth visit: Medical Nutrition Therapy for CKD and weight management, referred by counselor Nicholas Coleman, [place].  Relevant history/background: In addition to adrenal insuffic and stage 3 CKD, [name] has a hx of HLD and HTN.  Nicholas Coleman is on chronic prednisone for adrenal insufficiency, and better BG management will be good for his renal and overall health.  Nicholas Coleman works at the [employer], and he has a very variable schedule, which includes different shifts and different days of the week.  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  They both want to lose some weight.  Nicholas Coleman has done [employer] & many wt loss diets in past, and more recently a keto diet, which was too difficult and caused gall bladder problems for him.  Assessment:  Vitals: Ht 73"; wt 243 lb; BMI 32 on 12/09/18. Usual eating pattern: 3 meals and 2-4 snacks per day, usually having at least one to two snacks in the night.  (Nicholas Coleman often grazes more than eats meals.)  Frequent foods and beverages: diet flavored water, 2% milk; frozen dinners at work, sandwiches, grapes and strawberries.  Nicholas Coleman [facility] - water, coffee w/ cream, 2% or whole milk; fruit, yogurt; sandwiches, frozen meals, toast w/ alm butter or diet jelly.) Avoided foods: coffee, Br sprouts, plain  yogurt.   Usual physical activity: walks at work - 8-9,000 steps per day.  Nicholas Coleman - walks ~15 min 2-3 X wk.) Sleep: Estimates he sleeps in 1 to 2-hr increments for total of 6-8 hrs/night.  Gets less sleep 14 days a month when he works night shift (7 P to 7A).  Nicholas Coleman - estimates she gets at least 8 hrs/night.) 24-hr recall suggests intake of  kcal:  (Up at 9 AM) B (9:15 AM)-  2 slc toast, 2 tsp butter, 2 tsp jelly, 8 oz 2% milk Snk ( AM)-  --- L ( PM)-  Open-face sandw: rotisserie chx, tomato, Cheddar cheese, 1 tsp mayo, 8 oz milk Snk ( PM)-  24 oz hard cider D (6 PM)-  6 oz bbq beef ribs, 1 c frzn mixed veg's, 24 oz hard cider Snk ( PM)-   Typical day? Yes.    Intervention: Completed diet and exercise history, and established behavioral goals.  Could not cover all behaviors that need to be addressed, but ensured patient we will discuss habitual night-time snacking at f/u appt in 2 weeks.    For recommendations and goals, see Patient Instructions.    Follow-up: Telehealth visit in 2 weeks.    Nicholas Coleman,Nicholas Coleman

## 2019-03-10 ENCOUNTER — Ambulatory Visit: Payer: Managed Care, Other (non HMO) | Admitting: Internal Medicine

## 2019-03-10 NOTE — Progress Notes (Deleted)
Name: Nicholas Coleman  MRN/ DOB: [id number], 06-22-1960    Age/ Sex: 58 y.o., male     PCP: Lois Huxley, PA   Reason for Endocrinology Evaluation: Adrenal insufficiency     Initial Endocrinology Clinic Visit: 03/11/2018    PATIENT IDENTIFIER: Nicholas Coleman is a 58 y.o., male with a past medical history of HTN, Dyslipidemia and OSA. He has followed with [facility] since 03/12/2019 for consultative assistance with management of his adrenal insufficiency.   HISTORICAL SUMMARY: The patient was first diagnosed with primary adrenal insufficiency in 01/2018 after presenting to the ED with hypotension, nausea and lightheadedness. IN the ED, he was noted with hyponatremia with a serum sodium Nadir of 117 mEq/L and high normal K= of 5.0 mEq/L .  A cosyntropin Stimulation test was abnormal 60- minute cortisol level of 3.9 ug/dL, with a high ACTH  Level 363.0 pg/mL   He was discharged on HC 15 mg QAM and 10 mg QPM and florinef.  No FH of thyroid  Or autoimmune disease, that he is aware of.    SUBJECTIVE:   During last visit (12/09/2018): Continued prednisone 5 mg with breakfast and increased florinef to 0.1 mg daily   Today (12/09/2018):  Nicholas Coleman is here for a 3 month follow up on primary adrenal insufficiency. Somehow he did not increase his Florinef as advised on his last visit in March, 2020 He is compliant with prednisone dose . He is c/o dizziness, mainly positional  He has cough and occasional abdominal pain that he attributes to allergies.       Endocrine Medications  Prednisone 5 mg daily  Fludrocortisone 0.1 mg daily     ROS:  As per HPI.   HISTORY:  Past Medical History:  Past Medical History:  Diagnosis Date  . AKI (acute kidney injury) ([place]) 02/05/2018  . Atypical chest pain 12/11/2017  . BPH (benign prostatic hyperplasia) 07/12/2015  . Essential hypertension 07/12/2015  . Exertional dyspnea 12/11/2017  . Hyperlipidemia   .  Hypertension     Past Surgical History: No past surgical history on file.  Social History:  reports that he has never smoked. He has never used smokeless tobacco. He reports that he does not drink alcohol or use drugs. Family History:  Family History  Problem Relation Age of Onset  . Lymphoma Mother   . Stroke Father   . CAD Father   . Angioedema Neg Hx   . Allergic rhinitis Neg Hx   . Asthma Neg Hx   . Eczema Neg Hx   . Immunodeficiency Neg Hx   . Urticaria Neg Hx      HOME MEDICATIONS: Allergies as of 03/10/2019      Reactions   Hydrocodone-acetaminophen Anxiety, Other (See Comments)   Insomnia, "jittery"   Oxycodone Anxiety, Other (See Comments)   Insomnia, "jittery"      Medication List       Accurate as of March 10, 2019  7:36 AM. If you have any questions, ask your nurse or doctor.        albuterol 108 (90 Base) MCG/ACT inhaler Commonly known as: ProAir HFA Inhale 2 puffs into the lungs every 4 (four) hours as needed for wheezing or shortness of breath.   alfuzosin 10 MG 24 hr tablet Commonly known as: UROXATRAL Take 10 mg by mouth at bedtime.   buPROPion 150 MG 24 hr tablet Commonly known as: WELLBUTRIN XL Take 150 mg by mouth daily.  calcium-vitamin D 500-200 MG-UNIT tablet Commonly known as: OSCAL WITH D Take 1 tablet by mouth.   cetirizine 10 MG tablet Commonly known as: ZYRTEC Take 10 mg by mouth daily.   co-enzyme Q-10 30 MG capsule Take 30 mg by mouth daily.   fenoprofen 600 MG Tabs tablet Commonly known as: NALFON Take 600 mg by mouth.   FISH OIL PO Take 1 capsule by mouth daily.   fludrocortisone 0.1 MG tablet Commonly known as: FLORINEF Take 1 tablet (0.1 mg total) by mouth daily.   fluticasone 50 MCG/ACT nasal spray Commonly known as: FLONASE 2 sprays per nostril at night   Magnesium 100 MG Tabs Take by mouth.   montelukast 10 MG tablet Commonly known as: Singulair Take 1 tablet at night for coughing or wheezing.     multivitamin capsule Take 1 capsule by mouth daily.   omeprazole 40 MG capsule Commonly known as: PRILOSEC Take 1 capsule (40 mg total) by mouth daily as needed.   simvastatin 40 MG tablet Commonly known as: ZOCOR Take 40 mg by mouth at bedtime.   vitamin C 100 MG tablet Take 100 mg by mouth daily.   Vitamin D 125 MCG (5000 UT) Caps Take 5,000 Units by mouth.   Vitamin E 100 units Tabs Take 180 mg by mouth daily.         OBJECTIVE:   PHYSICAL EXAM: VS: There were no vitals taken for this visit.   There is no height or weight on file to calculate BSA.   EXAM: General: Pt appears well and is in NAD  Ears, Nose, Throat: Hearing: Grossly intact bilaterally Dental: Good dentition  Throat: Clear without mass, erythema or exudate  Neck: General: Supple without adenopathy. Thyroid: Thyroid size normal.  No goiter or nodules appreciated. No thyroid bruit.  Lungs: Clear with good BS bilat with no rales, rhonchi, or wheezes  Heart: Auscultation: RRR.  Abdomen: Normoactive bowel sounds, soft, nontender, without masses or organomegaly palpable  Extremities:  BL LE: No pretibial edema normal ROM and strength.  Skin: Hair: Texture and amount normal with gender appropriate distribution Skin Inspection: No rashes.Pt with tan color on exposed area, no hyperpigmentation of the buccal mucosa or palms.  Skin Palpation: Skin temperature, texture, and thickness normal to palpation  Neuro: Cranial nerves: II - XII grossly intact  Motor: Normal strength throughout DTRs: 2+ and symmetric in UE without delay in relaxation phase  Mental Status: Judgment, insight: Intact Memory: Intact for recent and remote events Mood and affect: No depression, anxiety, or agitation     DATA REVIEWED:  Results for ALMOND, FITZGIBBON (MRN [id number]) as of 06/10/2018 12:41  Ref. Range 02/06/2018 06:28  Cortisol, Base Latest Units: ug/dL 3.8  Cortisol, 30 Min Latest Units: ug/dL 4.2  Cortisol, 60 Min  Latest Units: ug/dL 3.9   Results for SASHA, RUETH (MRN [id number]) as of 06/10/2018 12:41  Ref. Range 02/06/2018 16:05 02/07/2018 07:00 03/11/2018 09:18  C206 ACTH Latest Ref Range: 6 - 50 pg/mL 363.0 (H) 423.0 (H) 839 (H)   Results for DRAYVEN, MARCHENA (MRN [id number]) as of 12/10/2018 11:32  Ref. Range 12/09/2018 09:15  Sodium Latest Ref Range: 135 - 145 mEq/L 131 (L)  Potassium Latest Ref Range: 3.5 - 5.1 mEq/L 4.8  Chloride Latest Ref Range: 96 - 112 mEq/L 97  CO2 Latest Ref Range: 19 - 32 mEq/L 25  Glucose Latest Ref Range: 70 - 99 mg/dL 98  BUN Latest Ref Range: 6 - 23 mg/dL  42 (H)  Creatinine Latest Ref Range: 0.40 - 1.50 mg/dL 0.861.19  Calcium Latest Ref Range: 8.4 - 10.5 mg/dL 9.8  Alkaline Phosphatase Latest Ref Range: 39 - 117 U/L 43  Albumin Latest Ref Range: 3.5 - 5.2 g/dL 4.4  AST Latest Ref Range: 0 - 37 U/L 17  ALT Latest Ref Range: 0 - 53 U/L 29  Total Protein Latest Ref Range: 6.0 - 8.3 g/dL 7.1  Total Bilirubin Latest Ref Range: 0.2 - 1.2 mg/dL 0.4  GFR Latest Ref Range: >60.00 mL/min 62.33  VITD Latest Ref Range: 30.00 - 100.00 ng/mL 44.79  WBC Latest Ref Range: 4.0 - 10.5 K/uL 8.9  RBC Latest Ref Range: 4.22 - 5.81 Mil/uL 4.63  Hemoglobin Latest Ref Range: 13.0 - 17.0 g/dL 57.814.3  HCT Latest Ref Range: 39.0 - 52.0 % 41.2  MCV Latest Ref Range: 78.0 - 100.0 fl 89.1  MCHC Latest Ref Range: 30.0 - 36.0 g/dL 46.934.8  RDW Latest Ref Range: 11.5 - 15.5 % 13.8  Platelets Latest Ref Range: 150.0 - 400.0 K/uL 175.0  Neutrophils Latest Ref Range: 43.0 - 77.0 % 63.7  Lymphocytes Latest Ref Range: 12.0 - 46.0 % 24.1  Monocytes Relative Latest Ref Range: 3.0 - 12.0 % 8.1  Eosinophil Latest Ref Range: 0.0 - 5.0 % 3.4  Basophil Latest Ref Range: 0.0 - 3.0 % 0.7  NEUT# Latest Ref Range: 1.4 - 7.7 K/uL 5.6  Lymphocyte # Latest Ref Range: 0.7 - 4.0 K/uL 2.1  Monocyte # Latest Ref Range: 0.1 - 1.0 K/uL 0.7  Eosinophils Absolute Latest Ref Range: 0.0 - 0.7 K/uL 0.3  Basophils  Absolute Latest Ref Range: 0.0 - 0.1 K/uL 0.1  TSH Latest Ref Range: 0.35 - 4.50 uIU/mL 2.66  T4,Free(Direct) Latest Ref Range: 0.60 - 1.60 ng/dL 6.290.80   ASSESSMENT / PLAN / RECOMMENDATIONS:   1. Primary Adrenal Insufficiency :  - Unclear cause at this time, iron studies were unremarkable, CT scan was normal around the time of his diagnosis. He was on this high fat diet at the time.  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 Pt never increased his fludrocortisone as advised back in March.   - Repeat BMP today continues to show low Na and high-normal potassium, pt was advised to increase to a one full tablet and to contact us in 2 weeks if no improvement in symptoms.  - He was also advised that during the hot summer days he will have to increase the fludrocortisone .  - We again reviewed the sick day rule.    Medications   Prednisone 5 mg daily with Breakfast   Florinef to 0.1 mg daily     F/u in 3 months  Addendum: Discussed lab results with the patient on 9/29 at 10 AM     Signed electronically by: Lyndle HerrlichAbby Jaralla Shanyah Gattuso, MD  [facility]  [facility] [address] [place], [place] [address] Phone: [phone] FAX: [phone]      CC: Wilfrid LundBecker, Anna G, PA [address] [place] [address] Phone: [phone]  Fax: [phone]   Return to Endocrinology clinic as below: Future Appointments  Date Time Provider Department Center  03/10/2019  8:30 AM Raynard Mapps, Konrad DoloresIbtehal Jaralla, MD [facility] None  03/11/2019  1:30 PM Linna DarnerSykes, Jean C, RD [facility]

## 2019-03-11 ENCOUNTER — Other Ambulatory Visit: Payer: Self-pay

## 2019-03-11 ENCOUNTER — Telehealth: Payer: Self-pay | Admitting: Family Medicine

## 2019-03-11 ENCOUNTER — Ambulatory Visit (INDEPENDENT_AMBULATORY_CARE_PROVIDER_SITE_OTHER): Payer: Managed Care, Other (non HMO) | Admitting: Family Medicine

## 2019-03-11 DIAGNOSIS — Z713 Dietary counseling and surveillance: Secondary | ICD-10-CM | POA: Diagnosis not present

## 2019-03-11 DIAGNOSIS — N183 Chronic kidney disease, stage 3 unspecified: Secondary | ICD-10-CM | POA: Diagnosis not present

## 2019-03-11 DIAGNOSIS — I10 Essential (primary) hypertension: Secondary | ICD-10-CM

## 2019-03-11 DIAGNOSIS — E669 Obesity, unspecified: Secondary | ICD-10-CM

## 2019-03-11 MED ORDER — FLUTICASONE PROPIONATE 50 MCG/ACT NA SUSP: NASAL | 5 refills | Status: DC

## 2019-03-11 MED ORDER — OMEPRAZOLE 40 MG PO CPDR: 40.0000 mg | DELAYED_RELEASE_CAPSULE | Freq: Every day | ORAL | 5 refills | Status: AC | PRN

## 2019-03-11 MED ORDER — CETIRIZINE HCL 10 MG PO TABS: 10.0000 mg | ORAL_TABLET | Freq: Every day | ORAL | 5 refills | Status: AC | PRN

## 2019-03-11 MED ORDER — MONTELUKAST SODIUM 10 MG PO TABS: ORAL_TABLET | ORAL | 5 refills | Status: DC

## 2019-03-11 NOTE — Telephone Encounter (Signed)
Patient reporting some medications were not at the pharmacy. He has moved recently and is requesting a new pharmacy for his medications to be sent to. Our office will send out allergen immunotherapy to the address listed in his chart which he verified is correct. He will call with any further questions

## 2019-03-11 NOTE — Telephone Encounter (Signed)
Thank you :)

## 2019-03-11 NOTE — Patient Instructions (Addendum)
-   Call [employer] to confirm that 3 nutrition visits per year are covered in 2021.  Also request a new referral from Marilynne Drivers, [place].    - Complete the Meal Planning Form provided for two different purposes: (1) meals for work; and (2) dinner meals for home.  - Use your planned meals as a basis for shopping, so you have ingredients on hand on any given day.   - Refer to recommendations from last appt as a reminder of choices you want to eventually be making routinely, and to know how to determine what amount of a carb food equals one portion.    Specific Goals: 1. Include a protein food, starch food, and vegetables at a minimum of 10 meals per week.   2. Limit starch portions to 2 per meal.    ([name]'s goals are to obtain at least 48 oz of water/day and exercise at least 30 min 3 X wk.)  - DOCUMENT YOUR PROGRESS USING THE GOALS SHEET PROVIDED TODAY (or by any method that works well for you).    - Remember our discussion today about being more deliberate about your food choices.    - Follow-up appt on Thursday, January 4 at 1:30 PM via [email or web address]: [email or web address]

## 2019-03-11 NOTE — Progress Notes (Signed)
PCP Nicholas Drivers, PA  Wife [place] Encounter I connected with Nicholas Coleman (MRN [id number]) on 03/11/2019 by MyChart video-enabled, HIPAA-compliant telemedicine application, verified that I am speaking with the correct person using two identifiers, and that the patient was in a private environment conducive to confidentiality.  The patient agreed to proceed.  Provider was Kennith Center, PhD, RD, LDN, CEDRD Provider(s) located at [facility] during this telehealth encounter; patient and wife Nicholas Coleman were at home  Appt start time: 1330 end time: 1430 (1 hour)  Reason for telehealth visit: Medical Nutrition Therapy for CKD and weight management, referred by counselor Nicholas Coleman, [place].    Relevant history/background: In addition to adrenal insuffic and stage 3 CKD, [name] has a hx of HLD and HTN.  Nicholas Coleman is on chronic prednisone for adrenal insufficiency, and better BG management will be good for his renal and overall health.  Nicholas Coleman's work at [employer] requires different shifts different days of the week.  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  They both want to lose some weight.  Nicholas Coleman has done [employer] & many wt loss diets in past, including a keto diet, which was too difficult and caused gall bladder problems for him.  (Ht 73"; wt 243 lb; BMI 32 on 12/09/18.) Assessment:  Recent eating pattern: 3 meals and 0-1 snacks per day, plus 2-3 snacks in the night.   Usual physical activity: walks at work - 10,000 steps per day.   Sleep: Sleep has improved slightly to 3-hr increments for total of 6-8 hrs/night. Not sure why.  (Still less sleep 14 days a month when he works 7 P to 7A shift). 24-hr recall:  (Up at 7:30 AM) B (8 AM)-  6 oz 2% milk Snk ( AM)-   L (1 PM)-  6 oz duck, 1 c stuffing, 1 1/2 c spaghetti squash w/ butter & brn sugar, 1 c 2% milk Snk ( PM)-   D (6 PM)-  2-3 oz colby jack chs, ~20  Ritz crkrs, 12 slices salami, 16 oz rum egg nog Snk (12 & 3AM)-  1 c peanuts, 1 c 2% milk; 1 c 2% milk Typical day? Yes.  Typical for holidays, but usually has bkfst, and a balanced meal for dinner.     Intervention: Reviewed progress on behavioral goals established on 02/25/19, and encouraged Nicholas Coleman and Nicholas Coleman to be more deliberate about planning and choosing foods.  Also discussed concept that taste preferences are learned, e.g., water vs. flavored water.  Did not have time to address night eating.    For recommendations and goals, see Patient Instructions.    Follow-up: Telehealth visit in 5 weeks.    Nicholas Coleman,Nicholas Coleman

## 2019-03-11 NOTE — Telephone Encounter (Signed)
Mailed out paper work to the pt about immunotherapy

## 2019-03-17 ENCOUNTER — Other Ambulatory Visit: Payer: Self-pay

## 2019-03-18 ENCOUNTER — Encounter: Payer: Self-pay | Admitting: Internal Medicine

## 2019-03-18 ENCOUNTER — Other Ambulatory Visit: Payer: Self-pay

## 2019-03-18 ENCOUNTER — Ambulatory Visit (INDEPENDENT_AMBULATORY_CARE_PROVIDER_SITE_OTHER): Payer: Managed Care, Other (non HMO) | Admitting: Internal Medicine

## 2019-03-18 VITALS — BP 118/68 | HR 70 | Temp 98.0°F | Ht 73.0 in | Wt 255.2 lb

## 2019-03-18 DIAGNOSIS — E271 Primary adrenocortical insufficiency: Secondary | ICD-10-CM | POA: Diagnosis not present

## 2019-03-18 LAB — T4, FREE: Free T4: 0.75 ng/dL (ref 0.60–1.60)

## 2019-03-18 LAB — BASIC METABOLIC PANEL WITH GFR
BUN: 28 mg/dL — ABNORMAL HIGH (ref 6–23)
CO2: 29 meq/L (ref 19–32)
Calcium: 9.7 mg/dL (ref 8.4–10.5)
Chloride: 99 meq/L (ref 96–112)
Creatinine, Ser: 1.19 mg/dL (ref 0.40–1.50)
GFR: 62.28 mL/min
Glucose, Bld: 105 mg/dL — ABNORMAL HIGH (ref 70–99)
Potassium: 4.1 meq/L (ref 3.5–5.1)
Sodium: 135 meq/L (ref 135–145)

## 2019-03-18 LAB — TSH: TSH: 1.01 u[IU]/mL (ref 0.35–4.50)

## 2019-03-18 MED ORDER — PREDNISONE 5 MG PO TABS: 5.0000 mg | ORAL_TABLET | Freq: Every day | ORAL | 3 refills | Status: DC

## 2019-03-18 MED ORDER — FLUDROCORTISONE ACETATE 0.1 MG PO TABS: 0.1000 mg | ORAL_TABLET | Freq: Every day | ORAL | 3 refills | Status: DC

## 2019-03-18 NOTE — Progress Notes (Signed)
Name: Nicholas Coleman  MRN/ DOB: [id number], 08-16-1960    Age/ Sex: 59 y.o., male     PCP: Lois Huxley, PA   Reason for Endocrinology Evaluation: Adrenal insufficiency     Initial Endocrinology Clinic Visit: 03/11/2018    PATIENT IDENTIFIER: [name] is a 59 y.o., male with a past medical history of HTN, Dyslipidemia and OSA. He has followed with [facility] since 03/12/2019 for consultative assistance with management of his adrenal insufficiency.   HISTORICAL SUMMARY: The patient was first diagnosed with primary adrenal insufficiency in 01/2018 after presenting to the ED with hypotension, nausea and lightheadedness. IN the ED, he was noted with hyponatremia with a serum sodium Nadir of 117 mEq/L and high normal K= of 5.0 mEq/L .  A cosyntropin Stimulation test was abnormal 60- minute cortisol level of 3.9 ug/dL, with a high ACTH  Level 363.0 pg/mL   He was discharged on HC 15 mg QAM and 10 mg QPM and florinef.  No FH of thyroid  Or autoimmune disease, that he is aware of.    SUBJECTIVE:   During last visit (12/09/2018): Continued prednisone 5 mg with breakfast and increased florinef to 0.1 mg daily   Today (12/09/2018):  [name] is here for a 3 month follow up on primary adrenal insufficiency. He is compliant with prednisone dose . He is concerned about his weight gain, he attributes this to prednisone intake.  His fatigue and dizziness have resolved.      Endocrine Medications  Prednisone 5 mg daily  Fludrocortisone 0.1 mg daily     ROS:  As per HPI.   HISTORY:  Past Medical History:  Past Medical History:  Diagnosis Date  . AKI (acute kidney injury) ([place]) 02/05/2018  . Atypical chest pain 12/11/2017  . BPH (benign prostatic hyperplasia) 07/12/2015  . Essential hypertension 07/12/2015  . Exertional dyspnea 12/11/2017  . Hyperlipidemia   . Hypertension     Past Surgical History: No past surgical history on file.  Social  History:  reports that he has never smoked. He has never used smokeless tobacco. He reports that he does not drink alcohol or use drugs. Family History:  Family History  Problem Relation Age of Onset  . Lymphoma Mother   . Stroke Father   . CAD Father   . Angioedema Neg Hx   . Allergic rhinitis Neg Hx   . Asthma Neg Hx   . Eczema Neg Hx   . Immunodeficiency Neg Hx   . Urticaria Neg Hx      HOME MEDICATIONS: Allergies as of 03/18/2019      Reactions   Other Cough   Cat and dog dander cough and sinus build up   [facility] Other (See Comments)   Cough and sinus build up   Hydrocodone-acetaminophen Anxiety, Other (See Comments)   Insomnia, "jittery"   Oxycodone Anxiety, Other (See Comments)   Insomnia, "jittery"      Medication List       Accurate as of March 18, 2019  9:35 AM. If you have any questions, ask your nurse or doctor.        albuterol 108 (90 Base) MCG/ACT inhaler Commonly known as: ProAir HFA Inhale 2 puffs into the lungs every 4 (four) hours as needed for wheezing or shortness of breath.   alfuzosin 10 MG 24 hr tablet Commonly known as: UROXATRAL Take 10 mg by mouth at bedtime.   buPROPion 150 MG 24 hr tablet  Commonly known as: WELLBUTRIN XL Take 150 mg by mouth daily.   calcium-vitamin D 500-200 MG-UNIT tablet Commonly known as: OSCAL WITH D Take 1 tablet by mouth.   cetirizine 10 MG tablet Commonly known as: ZYRTEC Take 10 mg by mouth daily.   cetirizine 10 MG tablet Commonly known as: ZYRTEC Take 1 tablet (10 mg total) by mouth daily as needed for allergies.   co-enzyme Q-10 30 MG capsule Take 30 mg by mouth daily.   fenoprofen 600 MG Tabs tablet Commonly known as: NALFON Take 600 mg by mouth.   FISH OIL PO Take 1 capsule by mouth daily.   fludrocortisone 0.1 MG tablet Commonly known as: FLORINEF Take 1 tablet (0.1 mg total) by mouth daily.   fluticasone 50 MCG/ACT nasal spray Commonly known as: FLONASE 2 sprays per nostril at  night   Magnesium 100 MG Tabs Take by mouth.   montelukast 10 MG tablet Commonly known as: Singulair Take 1 tablet at night for coughing or wheezing.   multivitamin capsule Take 1 capsule by mouth daily.   omeprazole 40 MG capsule Commonly known as: PRILOSEC Take 1 capsule (40 mg total) by mouth daily as needed.   simvastatin 40 MG tablet Commonly known as: ZOCOR Take 40 mg by mouth at bedtime.   vitamin C 100 MG tablet Take 100 mg by mouth daily.   Vitamin D 125 MCG (5000 UT) Caps Take 5,000 Units by mouth.   Vitamin E 100 units Tabs Take 180 mg by mouth daily.         OBJECTIVE:   PHYSICAL EXAM: VS: BP 118/68 (BP Location: Left Arm, Patient Position: Sitting, Cuff Size: Large)   Pulse 70   Temp 98 F (36.7 C)   Ht 6\' 1"  (1.854 m)   Wt 255 lb 3.2 oz (115.8 kg)   SpO2 98%   BMI 33.67 kg/m    Body surface area is 2.44 meters squared.   EXAM: General: Pt appears well and is in NAD  Neck: General: Supple without adenopathy. Thyroid: Thyroid size normal.  No goiter or nodules appreciated. No thyroid bruit.  Lungs: Clear with good BS bilat with no rales, rhonchi, or wheezes  Heart: Auscultation: RRR.  Abdomen: Normoactive bowel sounds, soft, nontender, without masses or organomegaly palpable  Extremities:  BL LE: No pretibial edema normal ROM and strength.  Neuro: Cranial nerves: II - XII grossly intact  DTRs: 2+ and symmetric in UE without delay in relaxation phase  Mental Status: Judgment, insight: Intact Mood and affect: No depression, anxiety, or agitation     DATA REVIEWED:  Results for TYREKE, KAESER (MRN Mardee Postin) as of 06/10/2018 12:41  Ref. Range 02/06/2018 06:28  Cortisol, Base Latest Units: ug/dL 3.8  Cortisol, 30 Min Latest Units: ug/dL 4.2  Cortisol, 60 Min Latest Units: ug/dL 3.9   Results for NICHOALS, HEYDE (MRN Mardee Postin) as of 06/10/2018 12:41  Ref. Range 02/06/2018 16:05 02/07/2018 07:00 03/11/2018 09:18  C206 ACTH Latest  Ref Range: 6 - 50 pg/mL 363.0 (H) 423.0 (H) 839 (H)   Results for IZICK, GASBARRO" (MRN Antonieta Iba) as of 03/19/2019 12:43  Ref. Range 03/18/2019 10:05  Sodium Latest Ref Range: 135 - 145 mEq/L 135  Potassium Latest Ref Range: 3.5 - 5.1 mEq/L 4.1  Chloride Latest Ref Range: 96 - 112 mEq/L 99  CO2 Latest Ref Range: 19 - 32 mEq/L 29  Glucose Latest Ref Range: 70 - 99 mg/dL 05/16/2019 (H)  BUN Latest Ref Range: 6 -  23 mg/dL 28 (H)  Creatinine Latest Ref Range: 0.40 - 1.50 mg/dL 4.00  Calcium Latest Ref Range: 8.4 - 10.5 mg/dL 9.7  GFR Latest Ref Range: >60.00 mL/min 62.28    ASSESSMENT / PLAN / RECOMMENDATIONS:   1. Primary Adrenal Insufficiency :  - Unclear cause, most likely autoimmune, iron studies were unremarkable, CT scan was normal. - Pt has a medical alert bracelet - Orthostatic hypotension resolved.     - Repeat BMP today is normal - We again reviewed the sick day rule.  - Pt is concerned about weight gain and attributes this to prednisone, I explained to him that he is on a physiologic dose of prednisone and this is what his body would normally make on a daily basis but because his adrenals don't work he needs this. We discussed that reducing the dose or stopping it could be fatal for him.    Medications   Prednisone 5 mg daily with Breakfast   Florinef to 0.1 mg daily   2. Weight gain:    - I assured him that even though prednisone does cause weight gain in his case his dose is physiologic and should not cause any consequences. Pt is wondering of natural alternatives but I explained to him that I am not aware of natural replacements as its not part of my training.  - Pt was directed to Dr. Quillian Quince for medical weight loss.     F/u in 1 yr    Signed electronically by: Lyndle Herrlich, MD  [facility] Endocrinology  [facility] [address] [place], [place] [address] Phone: [phone] FAX: [phone]      CC: Wilfrid Lund, [place] [address] [place] [address] Phone: [phone]  Fax: [phone]   Return to Endocrinology clinic as below: No future appointments.

## 2019-03-18 NOTE — Patient Instructions (Signed)
-  Continue Prednisone 5 mg daily with breakfast  - Continue Florinef 0.1 mg daily     ADRENAL INSUFFICIENCY SICK DAY RULES:  Should you face an extreme emotional or physical stress such as trauma, surgery or acute illness, this will require extra steroid coverage so that the body can meet that stress.   Without increasing the steroid dose you may experience severe weakness, headache, dizziness, nausea and vomiting and possibly a more serious deterioration in health.  Typically the dose of steroids will only need to be increased for a couple of days if you have an illness that is transient and managed in the community.   If you are unable to take/absorb an increased dose of steroids orally because of vomiting or diarrhea, you will urgently require steroid injections and should present to an Emergency Department.  The general advice for any serious illness is as follows: 1. Double the normal daily steroid dose for up to 3 days if you have a temperature of more than 37.50C (99.39F) with signs of sickness, or severe emotional or physical distress 2. Contact your primary care doctor and Endocrinologist if the illness worsens or it lasts for more than 3 days.  3. In cases of severe illness, urgent medical assistance should be promptly sought. 4. If you experience vomiting/diarrhea or are unable to take steroids by mouth, please administer the Hydrocortisone injection kit and seek urgent medical help.

## 2019-04-17 ENCOUNTER — Ambulatory Visit (INDEPENDENT_AMBULATORY_CARE_PROVIDER_SITE_OTHER): Payer: Managed Care, Other (non HMO) | Admitting: Family Medicine

## 2019-04-17 ENCOUNTER — Other Ambulatory Visit: Payer: Self-pay

## 2019-04-17 DIAGNOSIS — N183 Chronic kidney disease, stage 3 unspecified: Secondary | ICD-10-CM | POA: Diagnosis not present

## 2019-04-17 DIAGNOSIS — E669 Obesity, unspecified: Secondary | ICD-10-CM

## 2019-04-17 DIAGNOSIS — I10 Essential (primary) hypertension: Secondary | ICD-10-CM

## 2019-04-17 DIAGNOSIS — I129 Hypertensive chronic kidney disease with stage 1 through stage 4 chronic kidney disease, or unspecified chronic kidney disease: Secondary | ICD-10-CM | POA: Diagnosis not present

## 2019-04-17 DIAGNOSIS — E66811 Obesity, class 1: Secondary | ICD-10-CM

## 2019-04-17 NOTE — Progress Notes (Signed)
PCP Horton Marshall, PA  Wife Nicholas Coleman Telehealth Encounter I connected with Nicholas Coleman (MRN [id number]) on 04/17/2019 by MyChart video-enabled, HIPAA-compliant telemedicine application, verified that I am speaking with the correct person using two identifiers, and that the patient was in a private environment conducive to confidentiality.  The patient agreed to proceed.  Provider was Linna Darner, PhD, RD, LDN, CEDRD Provider(s) located at home during this telehealth encounter; patient and wife Nicholas Coleman were at home  Appt start time: 1330 end time: 1430 (1 hour)  Reason for telehealth visit: Medical Nutrition Therapy for CKD and weight management, referred by counselor [facility].    Relevant history/background: In addition to adrenal insuffic and stage 3 CKD, [name] has a hx of HLD and HTN.  [name] is on chronic prednisone for adrenal insufficiency, and better BG management will be good for his renal and overall health.  [name]'s work at [employer] requires different shifts different days of the week.  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  They both want to lose some weight.  [name] has done [employer] & many wt loss diets in past, including a keto diet, which was too difficult and caused gall bladder problems for him.  (Ht 73"; wt 243 lb; BMI 32 on 12/09/18.) Assessment: [name] and his wife have made a beef stew, soup, and roast with 3 X normal amt of vegetables, which they have enjoyed.  [name] is meeting his veg goal some days by consuming a juiced drink made from the following: 1 pineapple, 5 small apples, 3 oranges, 1 mango, 4 kiwis, 1 bunch celery, 3 beets, ~2 lb carrots, 2 large cucumbers.  Yields 16 1-cup servings (and ~8 cups pulp, suggesting most fiber is excluded). Recent eating pattern: 3 meals and 0-1 snacks per day, plus 2-3 snacks in the night.   Usual physical activity: walks at work ~10,000 steps per  day.   Sleep: Usually 3-hr increments for total of 6-8 hrs/night. (Less sleep 14 days a month when he works 7 P to 7A shift, such as this week). 24-hr recall:  (Slept only from 7:30-10:30 AM) B ( AM)-  --- Snk (12 AM)-  1 c of home-juiced juice L (2:30 PM)-  5 oz tuna fish, mayo, onions, 15 Ritz crackers,  oz 2% milk Snk (12 PM)-  1 c of home-juiced juice D ( PM)-  --- Snk (9 PM)-  4 slc toast,1 tsp butter,10 oz 2% milk Typical day? No. Missed dinner b/c went to church 7:00-9:00 PM last evening.   Intervention: Reviewed progress on behavioral goals, and reiterated importance of limiting carb.  I told [name] I will provide an estimated carbohydrate content for the juice he drinks regularly, so we can determine how much carbohydrate it provides per 8-oz serving.    For recommendations and goals, see Patient Instructions.    Follow-up: Telehealth visit in 5 weeks.    SYKES,JEANNIE

## 2019-04-17 NOTE — Patient Instructions (Addendum)
-   Controlling your blood sugar may be more challenging while taking prednisone, but not impossible. Limiting your carbohydrate intake will help to keep your blood sugar in a better range, which will also help you manage your weight.    Goals: 1. Limit starchy foods (bread, rice, potatoes, cereal, corn, all baked goods, pasta) to 2 servings per meal.  One serving = any of the following:              - 1 slice of bread or its equivalent             - 1/2 cup of a scoopable carb             - 15 grams of Total Carbohydrate on the nutrition facts panel             - 4 fluid oz of any sweet drink (or ~8 oz hard cider) You may want to do an online search for low-glycemic carb foods.   2. Include a protein food, starch food, and vegetables at a minimum of 10 meals per week.     3. If up during the night, have only water (no food or other drink).    - DOCUMENT YOUR PROGRESS USING THE GOALS SHEET PROVIDED TODAY  ([name]'s goals: Obtain at least 48 oz of water/day and exercise at least 30 min 3 X wk.)  Follow-up appt via [email or web address] on Thursday, March 18 at 1:30 PM.

## 2019-04-27 DIAGNOSIS — N281 Cyst of kidney, acquired: Secondary | ICD-10-CM | POA: Insufficient documentation

## 2019-05-15 ENCOUNTER — Ambulatory Visit: Payer: Managed Care, Other (non HMO) | Admitting: Sports Medicine

## 2019-05-15 ENCOUNTER — Telehealth: Payer: Managed Care, Other (non HMO) | Admitting: Family Medicine

## 2019-05-15 ENCOUNTER — Other Ambulatory Visit: Payer: Self-pay

## 2019-05-15 ENCOUNTER — Other Ambulatory Visit: Payer: Self-pay | Admitting: Sports Medicine

## 2019-05-15 ENCOUNTER — Ambulatory Visit
Admission: RE | Admit: 2019-05-15 | Discharge: 2019-05-15 | Disposition: A | Discharge status: Home / Self Care | Payer: Managed Care, Other (non HMO) | Source: Ambulatory Visit | Attending: Sports Medicine | Admitting: Sports Medicine

## 2019-05-15 VITALS — BP 116/82 | Ht 73.0 in | Wt 255.0 lb

## 2019-05-15 DIAGNOSIS — M2569 Stiffness of other specified joint, not elsewhere classified: Secondary | ICD-10-CM

## 2019-05-15 DIAGNOSIS — M79641 Pain in right hand: Secondary | ICD-10-CM

## 2019-05-15 DIAGNOSIS — M79642 Pain in left hand: Secondary | ICD-10-CM

## 2019-05-15 NOTE — H&P (Signed)
PCP: Lois Huxley, PA  Subjective:   HPI: Patient is a 59 y.o. male with history of adrenal insufficiency diagnosed 1 year ago here for right hand pain and low back stiffness.  Patient first noticed right hand pain 1 month ago. He describes it as a mild, aching pain, 1/10 in severity, that is constant. It is located between his MCP joint and PIPs on the right hand. He has used tiger balm once which provided some temporary relief. He has a history of carpal tunnel bilaterally that was surgically treated in [place] in 2013 (left hand) and 2014 (right hand) however this pain is distinctly different. There has been no trauma to the area in his recent memory.   Patient also notes stiffness in his low back and hips. He feels it all the time but it is most notable in the mornings or when he stands after prolonged sitting. Patient feels as if this stiffness is getting worse over the past month.   Past Medical History:  Diagnosis Date  . AKI (acute kidney injury) ([facility]) 02/05/2018  . Atypical chest pain 12/11/2017  . BPH (benign prostatic hyperplasia) 07/12/2015  . Essential hypertension 07/12/2015  . Exertional dyspnea 12/11/2017  . Hyperlipidemia   . Hypertension     Current Outpatient Medications on File Prior to Visit  Medication Sig Dispense Refill  . albuterol (PROAIR HFA) 108 (90 Base) MCG/ACT inhaler Inhale 2 puffs into the lungs every 4 (four) hours as needed for wheezing or shortness of breath. 18 g 1  . alfuzosin (UROXATRAL) 10 MG 24 hr tablet Take 10 mg by mouth at bedtime.     . Ascorbic Acid (VITAMIN C) 100 MG tablet Take 100 mg by mouth daily.    Marland Kitchen buPROPion (WELLBUTRIN XL) 150 MG 24 hr tablet Take 150 mg by mouth daily.    . calcium-vitamin D (OSCAL WITH D) 500-200 MG-UNIT tablet Take 1 tablet by mouth.    . cetirizine (ZYRTEC) 10 MG tablet Take 10 mg by mouth daily.    . cetirizine (ZYRTEC) 10 MG tablet Take 1 tablet (10 mg total) by mouth daily as needed for allergies. 31 tablet 5   . Cholecalciferol (VITAMIN D) 125 MCG (5000 UT) CAPS Take 5,000 Units by mouth.    . co-enzyme Q-10 30 MG capsule Take 30 mg by mouth daily.    . fenoprofen (NALFON) 600 MG TABS tablet Take 600 mg by mouth.    . fludrocortisone (FLORINEF) 0.1 MG tablet Take 1 tablet (0.1 mg total) by mouth daily. 90 tablet 3  . fluticasone (FLONASE) 50 MCG/ACT nasal spray 2 sprays per nostril at night 18.2 mL 5  . Magnesium 100 MG TABS Take by mouth.    . montelukast (SINGULAIR) 10 MG tablet Take 1 tablet at night for coughing or wheezing. 34 tablet 5  . Multiple Vitamin (MULTIVITAMIN) capsule Take 1 capsule by mouth daily.    . Omega-3 Fatty Acids (FISH OIL PO) Take 1 capsule by mouth daily.     Marland Kitchen omeprazole (PRILOSEC) 40 MG capsule Take 1 capsule (40 mg total) by mouth daily as needed. 30 capsule 5  . predniSONE (DELTASONE) 5 MG tablet Take 1 tablet (5 mg total) by mouth daily with breakfast. 120 tablet 3  . simvastatin (ZOCOR) 40 MG tablet Take 40 mg by mouth at bedtime.     . Vitamin E 100 units TABS Take 180 mg by mouth daily.     No current facility-administered medications on file prior to  visit.    No past surgical history on file.  Allergies  Allergen Reactions  . Other Cough    Cat and dog dander cough and sinus build up  . [place] Other (See Comments)    Cough and sinus build up  . Hydrocodone-Acetaminophen Anxiety and Other (See Comments)    Insomnia, "jittery"  . Oxycodone Anxiety and Other (See Comments)    Insomnia, "jittery"    Social History   Socioeconomic History  . Marital status: Married    Spouse name: Not on file  . Number of children: Not on file  . Years of education: Not on file  . Highest education level: Not on file  Occupational History  . Not on file  Tobacco Use  . Smoking status: Never Smoker  . Smokeless tobacco: Never Used  Substance and Sexual Activity  . Alcohol use: Never    Alcohol/week: 0.0 standard drinks  . Drug use: Never  . Sexual  activity: Not on file  Other Topics Concern  . Not on file  Social History Narrative  . Not on file   Social Determinants of Health   Financial Resource Strain:   . Difficulty of Paying Living Expenses: Not on file  Food Insecurity:   . Worried About Charity fundraiser in the Last Year: Not on file  . Ran Out of Food in the Last Year: Not on file  Transportation Needs:   . Lack of Transportation (Medical): Not on file  . Lack of Transportation (Non-Medical): Not on file  Physical Activity:   . Days of Exercise per Week: Not on file  . Minutes of Exercise per Session: Not on file  Stress:   . Feeling of Stress : Not on file  Social Connections:   . Frequency of Communication with Friends and Family: Not on file  . Frequency of Social Gatherings with Friends and Family: Not on file  . Attends Religious Services: Not on file  . Active Member of Clubs or Organizations: Not on file  . Attends Archivist Meetings: Not on file  . Marital Status: Not on file  Intimate Partner Violence:   . Fear of Current or Ex-Partner: Not on file  . Emotionally Abused: Not on file  . Physically Abused: Not on file  . Sexually Abused: Not on file    Family History  Problem Relation Age of Onset  . Lymphoma Mother   . Stroke Father   . CAD Father   . Angioedema Neg Hx   . Allergic rhinitis Neg Hx   . Asthma Neg Hx   . Eczema Neg Hx   . Immunodeficiency Neg Hx   . Urticaria Neg Hx     BP 116/82   Ht _0  (1.854 m)   Wt 255 lb (115.7 kg)   BMI 33.64 kg/m   Review of Systems: See HPI above.     Objective:  Physical Exam:  General: Alert and oriented x3, NAD, comfortable in exam room HEENT: normocephalic and atraumatic, PERRLA, EOMI, conjunctivae and sclerae clear, MMM, no erythema or exudates, trachea midline,  Right Hand Inspection: no skin changes, no swelling, no scarring, no overlying bruising or breaks in the skin, no obvious bony abnormalities Palpation: unable  to reproduce pain on palpation of MCP/MCP joints/or PIP ROM (active): patient able to flex and extend fingers completely without any problems Strength: 5/5 to flexion/extension/abduction/adducton Special Tests: negative MCP squeeze test Neurovascularity Intact  Hips: Smooth painless hip range of  motion.  Back: ROM (active): notable stiffness to flexion and extension of lumbar spine  Assessment & Plan:   Nicholas Coleman is a 59 y.o. male with history of adrenal insufficiency diagnosed 1 year ago presenting with mild right hand pain and low back stiffness for 1 month. Patient has no functional deficits of hand on exam with preserved strength and intact neurovascularity. Onset of pain and stiffness within 1 year of adrenal insufficiency could be suggestive of rheumatologic disease. Will obtain rheumatologic workup  To evaluate this etiology. OA is also on the differential given the patient's age, pain, and characteristic morning stiffness. Will obtain imaging of right hand and back to evaluate for skeletal abnormalities.   1. Right Hand Pain --AP of Right hand  2. Back Stiffness --2V thoracolumbar XRAY --Rheumatologic workup (CRP, ESR, ANA, RF, Anti-CCP Ab)    Carolyne Littles MS4  Patient seen and evaluated with the medical student.  I agree with the above plan of care.  Like to start with getting x-rays of the right hand as well as the thoracolumbar spine.  I would also like to get some blood work including a sed rate, C-reactive protein, ANA, rheumatoid factor, and anti-CCP antibody.  Phone follow-up with these study results when available.

## 2019-05-15 NOTE — Patient Instructions (Addendum)
You were seen today for right hand pain and diffuse back stiffness. We will obtain imaging of your hand and back with XRAYS.to look for any arthritis We will also obtain bloodwork to look possible rheumatologic causes of your symptoms. Go to get your bloodwork done today at: [facility] [address] [place] [address] We will provide phone follow up of results once bloodwork and imaging returns

## 2019-05-16 LAB — RHEUMATOID FACTOR: Rheumatoid fact SerPl-aCnc: 10.8 IU/mL (ref 0.0–13.9)

## 2019-05-16 LAB — ANA: Anti Nuclear Antibody (ANA): NEGATIVE

## 2019-05-16 LAB — SEDIMENTATION RATE: Sed Rate: 8 mm/hr (ref 0–30)

## 2019-05-16 LAB — C-REACTIVE PROTEIN: CRP: 2 mg/L (ref 0–10)

## 2019-05-19 ENCOUNTER — Other Ambulatory Visit: Payer: Self-pay | Admitting: *Deleted

## 2019-05-19 ENCOUNTER — Telehealth: Payer: Self-pay | Admitting: Sports Medicine

## 2019-05-19 NOTE — Telephone Encounter (Signed)
  I spoke with the patient on the phone today after reviewing x-rays of his right hand, thoracic spine, and lumbar spine.  X-ray of the right hand shows mild diffuse degenerative changes.  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  Thoracic spine shows mild multilevel degenerative changes.  Lumbar spine also shows multilevel degenerative changes, most pronounced at L1-L2 and L2-L3.  Based on these findings I recommend a trial of physical therapy.  We will refer him to a physical therapist near [place], [place].  He can wean to home exercise program per the therapist discretion.  Of note, his blood work does not show any evidence of rheumatological disease.  He is reassured of these findings.  Follow-up with me as needed.

## 2019-05-29 ENCOUNTER — Ambulatory Visit (INDEPENDENT_AMBULATORY_CARE_PROVIDER_SITE_OTHER): Payer: Managed Care, Other (non HMO) | Admitting: Family Medicine

## 2019-05-29 ENCOUNTER — Other Ambulatory Visit: Payer: Self-pay

## 2019-05-29 DIAGNOSIS — I10 Essential (primary) hypertension: Secondary | ICD-10-CM

## 2019-05-29 DIAGNOSIS — I129 Hypertensive chronic kidney disease with stage 1 through stage 4 chronic kidney disease, or unspecified chronic kidney disease: Secondary | ICD-10-CM | POA: Diagnosis not present

## 2019-05-29 DIAGNOSIS — N183 Chronic kidney disease, stage 3 unspecified: Secondary | ICD-10-CM

## 2019-05-29 DIAGNOSIS — E669 Obesity, unspecified: Secondary | ICD-10-CM | POA: Diagnosis not present

## 2019-05-29 NOTE — Progress Notes (Signed)
PCP Horton Marshall, PA  Wife Nicholas Coleman Telehealth Encounter I connected with Nicholas Coleman (MRN [id number]) on 05/29/2019 by MyChart video-enabled, HIPAA-compliant telemedicine application, verified that I am speaking with the correct person using two identifiers, and that the patient was in a private environment conducive to confidentiality.  The patient agreed to proceed.  Provider was Linna Darner, PhD, RD, LDN, CEDRD Provider was located at [facility] during this telehealth encounter; patient and wife Nicholas Coleman were at home  Appt start time: 1330 end time: 1430 (1 hour)  Reason for telehealth visit: Medical Nutrition Therapy for CKD and weight management, referred by counselor [facility].    Relevant history/background: In addition to adrenal insuffic and stage 3 CKD, Nicholas Coleman has a hx of HLD and HTN.  Nicholas Coleman is on chronic prednisone for adrenal insufficiency, and better BG management will be good for his renal and overall health.  Nicholas Coleman's work at [employer] requires different shifts different days of the week.  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  They both want to lose some weight.  Nicholas Coleman has done [employer] & many wt loss diets in past, including a keto diet, which was too difficult and caused gall bladder problems for him.  (Ht 73"; wt 243 lb; BMI 32 on 12/09/18.)  Assessment: Nicholas Coleman said he gets tired of vegetables after 2-3 days, so fails to meet vegetable goals consistently.  In general, he said he has not done well in meeting goals related at least in part to ongoing back and hip pain.  Admitted he forgot about the goal about limiting carbohydrate.  Has been going to PT for back pain, which he is optimistic about, however.   Recent eating pattern: 3 meals and 0-1 snacks per day, plus daily  snacks in the night, usually 1 c milk plus ~1-2 c peanuts.   Usual physical activity: walks at  work ~10,000 steps per day; walks 2000-4000 on non-work days.  Sleep: Usually 3-hr increments for total of 6-8 hrs/night. (Less sleep 14 days a month when he works 7 P to 7A shift, such as this week). 24-hr recall:  (Up at 8 AM) B (9 AM)-  1 c 2% milk Snk ( AM)-  --- L (1 PM)-  1 ham & chs sandw, 2 tsp mayo, 16 oz Monster drink 10 kcal)  Snk ( PM)-  --- D (6 PM)-  1 1/2 c veg&ham soup Snk (9 PM)-  3 c veg&ham soup, 2 slc bread, 2 tsp butter Typical day? No.  Just doesn't have a "typical day."  Intervention: Reviewed progress on behavioral goals, and again reiterated importance of limiting carb.  Had a conversation about what Nicholas Coleman perceives to be needed behavior change and his ability to effect that change.  Encouraged documentation as an important component of that effort.    For recommendations and goals, see Patient Instructions.    Follow-up: patient will check with insurance to see if any more visits per calendar year can be covered.      Debara Kamphuis,JEANNIE

## 2019-05-29 NOTE — Patient Instructions (Addendum)
-   Brainstorm ideas for:     - How to make it harder to go into the kitchen at night.  The idea here is to diminish temptation.      - Where is the best place for your Goals Sheet so you remember to use it.    Email your ideas for both of the above.    - Make a list vegetables you currently like and eat, then add to the list vegetables you are willing to try.  Search the Internet for vegetable recipe ideas.    Goals: 1. Limit starchy foods (bread, rice, potatoes, cereal, corn, all baked goods, pasta) to 2 servings per meal.  One serving = any of the following:  - 1 slice of bread or its equivalent - 1/2 cup of a scoopable carb - 15 grams of Total Carbohydrate on the nutrition facts panel - 4 fluid oz of any sweet drink (or ~8 oz hard cider) You may want to do an online search for low-glycemic carb foods.  2. Include a protein food, starch food, and vegetables at a minimum of 10 meals per week.   3. If up during the night, have only water (no food or other drink).    - DOCUMENT YOUR PROGRESS USING THE GOALS SHEET PROVIDED.   Remember that just b/c you have not documented much before doesn't mean you can't in the future.  Same goes for being "an exerciser" or "a vegetable eater."  Sometimes we limit ourselves by these self-images and descriptions!

## 2019-06-19 ENCOUNTER — Encounter: Payer: Self-pay | Admitting: Allergy and Immunology

## 2019-06-19 ENCOUNTER — Other Ambulatory Visit: Payer: Self-pay

## 2019-06-19 ENCOUNTER — Ambulatory Visit (INDEPENDENT_AMBULATORY_CARE_PROVIDER_SITE_OTHER): Payer: Managed Care, Other (non HMO) | Admitting: Allergy and Immunology

## 2019-06-19 VITALS — BP 124/76 | HR 84 | Temp 98.1°F | Resp 16 | Ht 73.5 in | Wt 261.0 lb

## 2019-06-19 DIAGNOSIS — J4530 Mild persistent asthma, uncomplicated: Secondary | ICD-10-CM

## 2019-06-19 DIAGNOSIS — H1013 Acute atopic conjunctivitis, bilateral: Secondary | ICD-10-CM

## 2019-06-19 DIAGNOSIS — K219 Gastro-esophageal reflux disease without esophagitis: Secondary | ICD-10-CM | POA: Insufficient documentation

## 2019-06-19 DIAGNOSIS — H1010 Acute atopic conjunctivitis, unspecified eye: Secondary | ICD-10-CM | POA: Insufficient documentation

## 2019-06-19 DIAGNOSIS — J3089 Other allergic rhinitis: Secondary | ICD-10-CM | POA: Diagnosis not present

## 2019-06-19 DIAGNOSIS — J4520 Mild intermittent asthma, uncomplicated: Secondary | ICD-10-CM | POA: Insufficient documentation

## 2019-06-19 HISTORY — DX: Mild persistent asthma, uncomplicated: J45.30

## 2019-06-19 MED ORDER — MONTELUKAST SODIUM 10 MG PO TABS: ORAL_TABLET | ORAL | 3 refills | Status: DC

## 2019-06-19 MED ORDER — MONTELUKAST SODIUM 10 MG PO TABS: ORAL_TABLET | ORAL | 5 refills | Status: DC

## 2019-06-19 NOTE — Assessment & Plan Note (Signed)
   Treatment plan as outlined above for allergic rhinitis.  A prescription has been provided for Pataday, one drop per eye daily as needed.  I have also recommended eye lubricant drops (i.e., Natural Tears) as needed. 

## 2019-06-19 NOTE — Progress Notes (Signed)
Follow-up Note  RE: Nicholas Coleman MRN: [id number] DOB: 1961/03/03 Date of Office Visit: 06/19/2019  Primary care provider: Wilfrid Lund, PA Referring provider: Wilfrid Lund, PA  History of present illness: Nicholas Coleman is a 59 y.o. male with persistent asthma, allergic rhinoconjunctivitis, gastroesophageal reflux presenting today for follow-up.  He was last seen in this clinic in December 2020.  He reports that overall his symptoms have improved in the interval since his previous visit.  He is currently taking cetirizine 10 mg daily and montelukast 10 mg daily as well as using nasal saline lavage daily.  He has not been requiring fluticasone nasal spray.  He reports that, despite his nasal allergy symptoms being well controlled, his "biggest problem" is postnasal drainage.  He had been considering starting immunotherapy injections, however because the symptoms are relatively well controlled he plans to continue using the medications as long as his symptoms are well controlled. His asthma has been well controlled and he has only required albuterol rescue 1 time over the past 2 months.  He does not experience limitations in normal daily activities or nocturnal awakenings due to lower respiratory symptoms. He has no reflux related complaints today.  He is currently taking omeprazole daily as prescribed.  Assessment and plan: Allergic rhinitis  Continue appropriate aeroallergen avoidance measures.  Continue cetirizine 10 mg daily if needed.  Fluticasone nasal spray, 2 sprays per nostril once daily as needed.  Nasal saline lavage (NeilMed) has been recommended as needed and prior to medicated nasal sprays along with instructions for proper administration.  For thick post nasal drainage, add guaifenesin [phone] mg (Mucinex)  twice daily as needed with adequate hydration as discussed.  If allergen avoidance measures and medications fail to adequately relieve symptoms,  aeroallergen immunotherapy will be considered.  Allergic conjunctivitis  Treatment plan as outlined above for allergic rhinitis.  A prescription has been provided for Pataday, one drop per eye daily as needed.  I have also recommended eye lubricant drops (i.e., Natural Tears) as needed.  Mild persistent asthma  Continue montelukast 10 mg daily at bedtime.  Continue albuterol HFA, 1 to 2 inhalations every 4-6 hours if needed.  Subjective and objective measures of pulmonary function will be followed and the treatment plan will be adjusted accordingly.  GERD (gastroesophageal reflux disease)  Continue appropriate reflux lifestyle modifications and omeprazole as prescribed.   Meds ordered this encounter  Medications  . DISCONTD: montelukast (SINGULAIR) 10 MG tablet    Sig: Take 1 tablet at night for coughing or wheezing.    Dispense:  34 tablet    Refill:  5  . montelukast (SINGULAIR) 10 MG tablet    Sig: Take 1 tablet at night for coughing or wheezing.    Dispense:  90 tablet    Refill:  3    Diagnostics: Spirometry reveals an FVC of 4.72 L and an FEV1 of 3.05 L (74% predicted) with an FEV1 ratio of 100%.  This study was performed while the patient was asymptomatic.  Please see scanned spirometry results for details.    Physical examination: Blood pressure 124/76, pulse 84, temperature 98.1 F (36.7 C), temperature source Oral, resp. rate 16, height 6' 1.5" (1.867 m), weight 261 lb (118.4 kg), SpO2 96 %.  General: Alert, interactive, in no acute distress. HEENT: TMs pearly gray, turbinates mildly edematous without discharge, post-pharynx mildly erythematous. Neck: Supple without lymphadenopathy. Lungs: Clear to auscultation without wheezing, rhonchi or rales. CV: Normal S1, S2 without murmurs.  Skin: Warm and dry, without lesions or rashes.  The following portions of the patient's history were reviewed and updated as appropriate: allergies, current medications, past  family history, past medical history, past social history, past surgical history and problem list.  Current Outpatient Medications  Medication Sig Dispense Refill  . alfuzosin (UROXATRAL) 10 MG 24 hr tablet Take 10 mg by mouth at bedtime.     . Ascorbic Acid (VITAMIN C) 100 MG tablet Take 100 mg by mouth daily.    Marland Kitchen buPROPion (WELLBUTRIN XL) 150 MG 24 hr tablet Take 150 mg by mouth daily.    . calcium-vitamin D (OSCAL WITH D) 500-200 MG-UNIT tablet Take 1 tablet by mouth.    . cetirizine (ZYRTEC) 10 MG tablet Take 1 tablet (10 mg total) by mouth daily as needed for allergies. 31 tablet 5  . co-enzyme Q-10 30 MG capsule Take 30 mg by mouth daily.    . Magnesium 100 MG TABS Take by mouth.    . montelukast (SINGULAIR) 10 MG tablet Take 1 tablet at night for coughing or wheezing. 90 tablet 3  . Multiple Vitamin (MULTIVITAMIN) capsule Take 1 capsule by mouth daily.    . Omega-3 Fatty Acids (FISH OIL PO) Take 1 capsule by mouth daily.     Marland Kitchen omeprazole (PRILOSEC) 40 MG capsule Take 1 capsule (40 mg total) by mouth daily as needed. 30 capsule 5  . predniSONE (DELTASONE) 5 MG tablet Take 1 tablet (5 mg total) by mouth daily with breakfast. 120 tablet 3  . simvastatin (ZOCOR) 40 MG tablet Take 40 mg by mouth at bedtime.     . Vitamin E 100 units TABS Take 180 mg by mouth daily.    Marland Kitchen albuterol (PROAIR HFA) 108 (90 Base) MCG/ACT inhaler Inhale 2 puffs into the lungs every 4 (four) hours as needed for wheezing or shortness of breath. (Patient not taking: Reported on 06/19/2019) 18 g 1  . fenoprofen (NALFON) 600 MG TABS tablet Take 600 mg by mouth.    . fludrocortisone (FLORINEF) 0.1 MG tablet Take 1 tablet (0.1 mg total) by mouth daily. (Patient not taking: Reported on 06/19/2019) 90 tablet 3  . fluticasone (FLONASE) 50 MCG/ACT nasal spray 2 sprays per nostril at night (Patient not taking: Reported on 06/19/2019) 18.2 mL 5   No current facility-administered medications for this visit.    Allergies  Allergen  Reactions  . Other Cough    Cat and dog dander cough and sinus build up  . [place] Other (See Comments)    Cough and sinus build up  . Hydrocodone-Acetaminophen Anxiety and Other (See Comments)    Insomnia, "jittery"  . Oxycodone Anxiety and Other (See Comments)    Insomnia, "jittery"   Review of systems: Review of systems negative except as noted in HPI / PMHx.  Past Medical History:  Diagnosis Date  . AKI (acute kidney injury) ([facility]) 02/05/2018  . Atypical chest pain 12/11/2017  . BPH (benign prostatic hyperplasia) 07/12/2015  . Essential hypertension 07/12/2015  . Exertional dyspnea 12/11/2017  . Hyperlipidemia   . Hypertension   . Mild persistent asthma 06/19/2019    Family History  Problem Relation Age of Onset  . Lymphoma Mother   . Stroke Father   . CAD Father   . Angioedema Neg Hx   . Allergic rhinitis Neg Hx   . Asthma Neg Hx   . Eczema Neg Hx   . Immunodeficiency Neg Hx   . Urticaria Neg Hx  Social History   Socioeconomic History  . Marital status: Married    Spouse name: Not on file  . Number of children: Not on file  . Years of education: Not on file  . Highest education level: Not on file  Occupational History  . Not on file  Tobacco Use  . Smoking status: Never Smoker  . Smokeless tobacco: Never Used  Substance and Sexual Activity  . Alcohol use: Never    Alcohol/week: 0.0 standard drinks  . Drug use: Never  . Sexual activity: Not on file  Other Topics Concern  . Not on file  Social History Narrative  . Not on file   Social Determinants of Health   Financial Resource Strain:   . Difficulty of Paying Living Expenses:   Food Insecurity:   . Worried About Charity fundraiser in the Last Year:   . Arboriculturist in the Last Year:   Transportation Needs:   . Film/video editor (Medical):   Marland Kitchen Lack of Transportation (Non-Medical):   Physical Activity:   . Days of Exercise per Week:   . Minutes of Exercise per Session:   Stress:   .  Feeling of Stress :   Social Connections:   . Frequency of Communication with Friends and Family:   . Frequency of Social Gatherings with Friends and Family:   . Attends Religious Services:   . Active Member of Clubs or Organizations:   . Attends Archivist Meetings:   Marland Kitchen Marital Status:   Intimate Partner Violence:   . Fear of Current or Ex-Partner:   . Emotionally Abused:   Marland Kitchen Physically Abused:   . Sexually Abused:     I appreciate the opportunity to take part in [facility] care. Please do not hesitate to contact me with questions.  Sincerely,   R. Edgar Frisk, MD

## 2019-06-19 NOTE — Assessment & Plan Note (Signed)
   Continue montelukast 10 mg daily at bedtime.  Continue albuterol HFA, 1 to 2 inhalations every 4-6 hours if needed.  Subjective and objective measures of pulmonary function will be followed and the treatment plan will be adjusted accordingly.

## 2019-06-19 NOTE — Patient Instructions (Addendum)
Allergic rhinitis  Continue appropriate aeroallergen avoidance measures.  Continue cetirizine 10 mg daily if needed.  Fluticasone nasal spray, 2 sprays per nostril once daily as needed.  Nasal saline lavage (NeilMed) has been recommended as needed and prior to medicated nasal sprays along with instructions for proper administration.  For thick post nasal drainage, add guaifenesin [phone] mg (Mucinex)  twice daily as needed with adequate hydration as discussed.  If allergen avoidance measures and medications fail to adequately relieve symptoms, aeroallergen immunotherapy will be considered.  Allergic conjunctivitis  Treatment plan as outlined above for allergic rhinitis.  A prescription has been provided for Pataday, one drop per eye daily as needed.  I have also recommended eye lubricant drops (i.e., Natural Tears) as needed.  Mild persistent asthma  Continue montelukast 10 mg daily at bedtime.  Continue albuterol HFA, 1 to 2 inhalations every 4-6 hours if needed.  Subjective and objective measures of pulmonary function will be followed and the treatment plan will be adjusted accordingly.  GERD (gastroesophageal reflux disease)  Continue appropriate reflux lifestyle modifications and omeprazole as prescribed.   Return in about 4 months (around 10/19/2019), or if symptoms worsen or fail to improve.

## 2019-06-19 NOTE — Assessment & Plan Note (Signed)
   Continue appropriate aeroallergen avoidance measures.  Continue cetirizine 10 mg daily if needed.  Fluticasone nasal spray, 2 sprays per nostril once daily as needed.  Nasal saline lavage (NeilMed) has been recommended as needed and prior to medicated nasal sprays along with instructions for proper administration.  For thick post nasal drainage, add guaifenesin [phone] mg (Mucinex)  twice daily as needed with adequate hydration as discussed.  If allergen avoidance measures and medications fail to adequately relieve symptoms, aeroallergen immunotherapy will be considered.

## 2019-06-19 NOTE — Assessment & Plan Note (Signed)
   Continue appropriate reflux lifestyle modifications and omeprazole as prescribed. 

## 2019-11-05 ENCOUNTER — Other Ambulatory Visit: Payer: Self-pay

## 2019-11-05 ENCOUNTER — Ambulatory Visit (INDEPENDENT_AMBULATORY_CARE_PROVIDER_SITE_OTHER): Payer: Managed Care, Other (non HMO) | Admitting: Allergy and Immunology

## 2019-11-05 ENCOUNTER — Encounter: Payer: Self-pay | Admitting: Allergy and Immunology

## 2019-11-05 VITALS — BP 124/70 | HR 72 | Temp 98.0°F | Resp 16

## 2019-11-05 DIAGNOSIS — J3089 Other allergic rhinitis: Secondary | ICD-10-CM

## 2019-11-05 DIAGNOSIS — K219 Gastro-esophageal reflux disease without esophagitis: Secondary | ICD-10-CM

## 2019-11-05 DIAGNOSIS — H1013 Acute atopic conjunctivitis, bilateral: Secondary | ICD-10-CM | POA: Diagnosis not present

## 2019-11-05 DIAGNOSIS — J4520 Mild intermittent asthma, uncomplicated: Secondary | ICD-10-CM | POA: Diagnosis not present

## 2019-11-05 NOTE — Patient Instructions (Addendum)
Mild intermittent asthma Well-controlled, we will stepdown therapy at this time.  Hold montelukast.  If lower respiratory symptoms progress in frequency and/or severity, the patient is to resume montelukast.  Continue albuterol HFA, 1 to 2 inhalations every 4-6 hours if needed.  Subjective and objective measures of pulmonary function will be followed and the treatment plan will be adjusted accordingly.  Allergic rhinitis  Continue appropriate aeroallergen avoidance measures.  Continue cetirizine 10 mg daily if needed.  Fluticasone nasal spray, 2 sprays per nostril once daily as needed.  Nasal saline lavage (NeilMed) has been recommended as needed and prior to medicated nasal sprays along with instructions for proper administration.  For thick post nasal drainage, add guaifenesin [phone] mg (Mucinex)  twice daily as needed with adequate hydration as discussed.  Allergic conjunctivitis  Treatment plan as outlined above for allergic rhinitis.  If needed, use Pataday 1 drop per eye daily if needed.  I have also recommended eye lubricant drops (i.e., Natural Tears) if needed.  GERD (gastroesophageal reflux disease)  Continue appropriate reflux lifestyle modifications.  Continue omeprazole as prescribed.   Return in about 6-12 months, or if symptoms worsen or fail to improve.

## 2019-11-05 NOTE — Assessment & Plan Note (Signed)
   Continue appropriate reflux lifestyle modifications.  Continue omeprazole as prescribed. 

## 2019-11-05 NOTE — Assessment & Plan Note (Signed)
   Treatment plan as outlined above for allergic rhinitis.  If needed, use Pataday 1 drop per eye daily if needed.  I have also recommended eye lubricant drops (i.e., Natural Tears) if needed.

## 2019-11-05 NOTE — Progress Notes (Signed)
Follow-up Note  RE: Nicholas Coleman MRN: [id number] DOB: 19-Aug-1960 Date of Office Visit: 11/05/2019  Primary care provider: Wilfrid Lund, PA Referring provider: Wilfrid Lund, PA  History of present illness: Nicholas Kuba" Coleman is a 59 y.o. male with persistent asthma, allergic rhinoconjunctivitis, and gastroesophageal reflux presented today for follow-up.  He was last seen in this clinic on June 19, 2019.  In the interval since his previous visit his asthma has been well controlled.  He has not required albuterol rescue and does not experience limitations in normal daily activities or nocturnal awakenings due to lower respiratory symptoms.  He is currently taking montelukast 10 mg daily at bedtime and has access to albuterol HFA.  He reports that until recently his nasal allergy symptoms have been well controlled.  Recently he has been experiencing some increased thick postnasal drainage which she attributes to the high pollen counts.  He currently takes cetirizine daily and fluticasone nasal spray sporadically if he is "really congested."  He has no ocular allergy symptom complaints today.  He has no reflux related complaints today.  Assessment and plan: Mild intermittent asthma Well-controlled, we will stepdown therapy at this time.  Hold montelukast.  If lower respiratory symptoms progress in frequency and/or severity, the patient is to resume montelukast.  Continue albuterol HFA, 1 to 2 inhalations every 4-6 hours if needed.  Subjective and objective measures of pulmonary function will be followed and the treatment plan will be adjusted accordingly.  Allergic rhinitis  Continue appropriate aeroallergen avoidance measures.  Continue cetirizine 10 mg daily if needed.  Fluticasone nasal spray, 2 sprays per nostril once daily as needed.  Nasal saline lavage (NeilMed) has been recommended as needed and prior to medicated nasal sprays along with instructions for proper  administration.  For thick post nasal drainage, add guaifenesin [phone] mg (Mucinex)  twice daily as needed with adequate hydration as discussed.  Allergic conjunctivitis  Treatment plan as outlined above for allergic rhinitis.  If needed, use Pataday 1 drop per eye daily if needed.  I have also recommended eye lubricant drops (i.e., Natural Tears) if needed.  GERD (gastroesophageal reflux disease)  Continue appropriate reflux lifestyle modifications.  Continue omeprazole as prescribed.   Diagnostics: Spirometry reveals an FVC of 4.09 L and an FEV1 of 3.12 L (76% predicted) with an FEV1 ratio of 101%.  Mild restrictive pattern consistent with previous studies.  This study was performed while the patient was asymptomatic.  Please see scanned spirometry results for details.    Physical examination: Blood pressure 124/70, pulse 72, temperature 98 F (36.7 C), temperature source Oral, resp. rate 16, SpO2 97 %.  General: Alert, interactive, in no acute distress. HEENT: TMs pearly gray, turbinates mildly edematous without discharge, post-pharynx mildly erythematous. Neck: Supple without lymphadenopathy. Lungs: Clear to auscultation without wheezing, rhonchi or rales. CV: Normal S1, S2 without murmurs. Skin: Warm and dry, without lesions or rashes.  The following portions of the patient's history were reviewed and updated as appropriate: allergies, current medications, past family history, past medical history, past social history, past surgical history and problem list.  Current Outpatient Medications  Medication Sig Dispense Refill  . albuterol (PROAIR HFA) 108 (90 Base) MCG/ACT inhaler Inhale 2 puffs into the lungs every 4 (four) hours as needed for wheezing or shortness of breath. 18 g 1  . alfuzosin (UROXATRAL) 10 MG 24 hr tablet Take 10 mg by mouth at bedtime.     . Ascorbic Acid (VITAMIN C)  100 MG tablet Take 100 mg by mouth daily.    Marland Kitchen buPROPion (WELLBUTRIN XL) 150 MG 24 hr  tablet Take 150 mg by mouth daily.    . calcium-vitamin D (OSCAL WITH D) 500-200 MG-UNIT tablet Take 1 tablet by mouth.    . cetirizine (ZYRTEC) 10 MG tablet Take 1 tablet (10 mg total) by mouth daily as needed for allergies. 31 tablet 5  . co-enzyme Q-10 30 MG capsule Take 30 mg by mouth daily.    . fludrocortisone (FLORINEF) 0.1 MG tablet Take 1 tablet (0.1 mg total) by mouth daily. 90 tablet 3  . Magnesium 100 MG TABS Take by mouth.    . montelukast (SINGULAIR) 10 MG tablet Take 1 tablet at night for coughing or wheezing. 90 tablet 3  . Multiple Vitamin (MULTIVITAMIN) capsule Take 1 capsule by mouth daily.    . Omega-3 Fatty Acids (FISH OIL PO) Take 1 capsule by mouth daily.     Marland Kitchen omeprazole (PRILOSEC) 40 MG capsule Take 1 capsule (40 mg total) by mouth daily as needed. 30 capsule 5  . predniSONE (DELTASONE) 5 MG tablet Take 1 tablet (5 mg total) by mouth daily with breakfast. 120 tablet 3  . simvastatin (ZOCOR) 40 MG tablet Take 40 mg by mouth at bedtime.     . Vitamin E 100 units TABS Take 180 mg by mouth daily.    . fluticasone (FLONASE) 50 MCG/ACT nasal spray 2 sprays per nostril at night (Patient not taking: Reported on 06/19/2019) 18.2 mL 5   No current facility-administered medications for this visit.    Allergies  Allergen Reactions  . Other Cough    Cat and dog dander cough and sinus build up  . [place] Other (See Comments)    Cough and sinus build up  . Hydrocodone-Acetaminophen Anxiety and Other (See Comments)    Insomnia, "jittery"  . Oxycodone Anxiety and Other (See Comments)    Insomnia, "jittery"   Review of systems: Review of systems negative except as noted in HPI / PMHx.  Past Medical History:  Diagnosis Date  . AKI (acute kidney injury) (HCC) 02/05/2018  . Atypical chest pain 12/11/2017  . BPH (benign prostatic hyperplasia) 07/12/2015  . Essential hypertension 07/12/2015  . Exertional dyspnea 12/11/2017  . Hyperlipidemia   . Hypertension   . Mild persistent  asthma 06/19/2019    Family History  Problem Relation Age of Onset  . Lymphoma Mother   . Stroke Father   . CAD Father   . Angioedema Neg Hx   . Allergic rhinitis Neg Hx   . Asthma Neg Hx   . Eczema Neg Hx   . Immunodeficiency Neg Hx   . Urticaria Neg Hx     Social History   Socioeconomic History  . Marital status: Married    Spouse name: Not on file  . Number of children: Not on file  . Years of education: Not on file  . Highest education level: Not on file  Occupational History  . Not on file  Tobacco Use  . Smoking status: Former Smoker    Types: Cigarettes  . Smokeless tobacco: Never Used  Vaping Use  . Vaping Use: Never used  Substance and Sexual Activity  . Alcohol use: Never    Alcohol/week: 0.0 standard drinks  . Drug use: Never  . Sexual activity: Not on file  Other Topics Concern  . Not on file  Social History Narrative  . Not on file   Social  Determinants of Health   Financial Resource Strain:   . Difficulty of Paying Living Expenses: Not on file  Food Insecurity:   . Worried About Programme researcher, broadcasting/film/video in the Last Year: Not on file  . Ran Out of Food in the Last Year: Not on file  Transportation Needs:   . Lack of Transportation (Medical): Not on file  . Lack of Transportation (Non-Medical): Not on file  Physical Activity:   . Days of Exercise per Week: Not on file  . Minutes of Exercise per Session: Not on file  Stress:   . Feeling of Stress : Not on file  Social Connections:   . Frequency of Communication with Friends and Family: Not on file  . Frequency of Social Gatherings with Friends and Family: Not on file  . Attends Religious Services: Not on file  . Active Member of Clubs or Organizations: Not on file  . Attends Banker Meetings: Not on file  . Marital Status: Not on file  Intimate Partner Violence:   . Fear of Current or Ex-Partner: Not on file  . Emotionally Abused: Not on file  . Physically Abused: Not on file  .  Sexually Abused: Not on file    I appreciate the opportunity to take part in [place] care. Please do not hesitate to contact me with questions.  Sincerely,   R. Jorene Guest, MD

## 2019-11-05 NOTE — Assessment & Plan Note (Signed)
   Continue appropriate aeroallergen avoidance measures.  Continue cetirizine 10 mg daily if needed.  Fluticasone nasal spray, 2 sprays per nostril once daily as needed.  Nasal saline lavage (NeilMed) has been recommended as needed and prior to medicated nasal sprays along with instructions for proper administration.  For thick post nasal drainage, add guaifenesin [phone] mg (Mucinex)  twice daily as needed with adequate hydration as discussed.

## 2019-11-05 NOTE — Assessment & Plan Note (Signed)
Well-controlled, we will stepdown therapy at this time.  Hold montelukast.  If lower respiratory symptoms progress in frequency and/or severity, the patient is to resume montelukast.  Continue albuterol HFA, 1 to 2 inhalations every 4-6 hours if needed.  Subjective and objective measures of pulmonary function will be followed and the treatment plan will be adjusted accordingly.

## 2019-12-30 ENCOUNTER — Telehealth: Payer: Self-pay | Admitting: Internal Medicine

## 2019-12-30 NOTE — Telephone Encounter (Signed)
Spoken to patient and was notified of Dr Harvel Ricks comments. Verbalized understanding.

## 2019-12-30 NOTE — Telephone Encounter (Signed)
Patient requests to be called at ph# [phone] re: Patient is ill with Covid 19 and states Dr. Lonzo Cloud told him if he got sick he should increase his medication, however patient does not know how much to to increase medication. Patient requests to be called at the ph# listed above to be advised.

## 2019-12-30 NOTE — Telephone Encounter (Signed)
Spoken to patient and get clarification. So patient stated that during his last appointment, he was told that if he ever get sick, he should increase both his medications. Patient wanted to know if he should or not and at what dosage.  Please advise.

## 2020-01-16 ENCOUNTER — Ambulatory Visit: Payer: Managed Care, Other (non HMO) | Admitting: Cardiovascular Disease

## 2020-03-24 ENCOUNTER — Encounter: Payer: Self-pay | Admitting: Cardiovascular Disease

## 2020-03-24 ENCOUNTER — Other Ambulatory Visit: Payer: Self-pay

## 2020-03-24 ENCOUNTER — Ambulatory Visit (INDEPENDENT_AMBULATORY_CARE_PROVIDER_SITE_OTHER): Payer: Managed Care, Other (non HMO) | Admitting: Cardiovascular Disease

## 2020-03-24 VITALS — BP 126/74 | HR 67 | Temp 98.2°F | Ht 73.0 in | Wt 252.2 lb

## 2020-03-24 DIAGNOSIS — N183 Chronic kidney disease, stage 3 unspecified: Secondary | ICD-10-CM

## 2020-03-24 DIAGNOSIS — I10 Essential (primary) hypertension: Secondary | ICD-10-CM | POA: Diagnosis not present

## 2020-03-24 DIAGNOSIS — Z9989 Dependence on other enabling machines and devices: Secondary | ICD-10-CM

## 2020-03-24 DIAGNOSIS — E669 Obesity, unspecified: Secondary | ICD-10-CM | POA: Diagnosis not present

## 2020-03-24 DIAGNOSIS — E7800 Pure hypercholesterolemia, unspecified: Secondary | ICD-10-CM

## 2020-03-24 DIAGNOSIS — G4733 Obstructive sleep apnea (adult) (pediatric): Secondary | ICD-10-CM | POA: Insufficient documentation

## 2020-03-24 DIAGNOSIS — E66811 Obesity, class 1: Secondary | ICD-10-CM

## 2020-03-24 HISTORY — DX: Obesity, class 1: E66.811

## 2020-03-24 HISTORY — DX: Obstructive sleep apnea (adult) (pediatric): G47.33

## 2020-03-24 HISTORY — DX: Obesity, unspecified: E66.9

## 2020-03-24 NOTE — Progress Notes (Signed)
Cardiology Office Note   Date:  03/24/2020   ID:  Nicholas Coleman, DOB 1961-02-22, MRN [id number]  PCP:  Nicholas Lund, PA  Cardiologist:   Nicholas Si, MD   No chief complaint on file.    History of Present Illness: Nicholas Coleman is a 60 y.o. male with OSA on CPAP,  Adrenal insufficiency, hypertension and hyperlipidemia who presents for follow up.  He was initially seen 07/2015 for the evaluation of chest discomfort.  His symptoms were atypical.  However given that he had risk factors he was referred for an ETT 07/2015 that was negative for ischemia.  He achieved 13.4 METS on a Bruce protocol.  At that time lipids were checked and it was recommended that he increase his simvastatin.  He never returned for follow-up testing.  He followed up 12/2017 at which time he was dealing with GI distress after changing his diet.  He had an abdominal ultrasound and CT that did not reveal any significant pathology.  He followed up with GI and has been doing better lately.  At that appointment he also complained of lightheadedness and dizziness after bending over and standing up.  He reported exertional dyspnea.  He was referred for coronary CT-A but never had it due to lack of insurance approval.  He did have an echo 12/2017 that revealed LVEF 55 to 60% with grade 1 diastolic dysfunction and mild LVH.  Due to his orthostatic symptoms hydrochlorothiazide was reduced to 12.5 mg.  He is no longer lightheaded or dizzy.  He has been exercising regularly without exertional dyspnea or chest pain.  He has no lower extremity edema, orthopnea, or PND.  He plans to start a new diet soon.  It will start with 10 days of detox from carbohydrates.  Nicholas Coleman was last seen in 2019.  At that time he was doing well. Lately he has been feeling well. He did have 1 episode of chest tightness that occurred several months ago. It occurred at rest. He has not had any exertional chest pain or shortness of breath. He works  12-hour shifts and does a lot of walking at work. He has no exertional chest pain or shortness of breath with those activities. He does note that if he goes up about 3 flights of steps he gets short of breath, but not more than he would expect. He has been using his treadmill a couple days per week outside of work and feels well. He denies any lower extremity edema, orthopnea, or PND. He sleeps with his CPAP machine regularly. He has been working with a nutritionist through his [employer]. He has been gluten-free, dairy free, and sugar-free. He has been able to lose 18 pounds in the last several months with this intervention. He also started an accountability program online this week. Overall he is feeling well and is without complaint.  Past Medical History:  Diagnosis Date  . AKI (acute kidney injury) (HCC) 02/05/2018  . Atypical chest pain 12/11/2017  . BPH (benign prostatic hyperplasia) 07/12/2015  . Essential hypertension 07/12/2015  . Exertional dyspnea 12/11/2017  . Hyperlipidemia   . Hypertension   . Mild persistent asthma 06/19/2019  . Obesity (BMI 30.0-34.9) 03/24/2020  . OSA on CPAP 03/24/2020    No past surgical history on file.   Current Outpatient Medications  Medication Sig Dispense Refill  . albuterol (PROAIR HFA) 108 (90 Base) MCG/ACT inhaler Inhale 2 puffs into the lungs every 4 (four) hours as  needed for wheezing or shortness of breath. 18 g 1  . alfuzosin (UROXATRAL) 10 MG 24 hr tablet Take 10 mg by mouth at bedtime.     . Ascorbic Acid (VITAMIN C) 100 MG tablet Take 100 mg by mouth daily.    Marland Kitchen buPROPion (WELLBUTRIN XL) 150 MG 24 hr tablet Take 150 mg by mouth daily.    . calcium-vitamin D (OSCAL WITH D) 500-200 MG-UNIT tablet Take 1 tablet by mouth.    . cetirizine (ZYRTEC) 10 MG tablet Take 1 tablet (10 mg total) by mouth daily as needed for allergies. 31 tablet 5  . co-enzyme Q-10 30 MG capsule Take 30 mg by mouth daily.    . fludrocortisone (FLORINEF) 0.1 MG tablet  Take 1 tablet (0.1 mg total) by mouth daily. 90 tablet 3  . fluticasone (FLONASE) 50 MCG/ACT nasal spray 2 sprays per nostril at night 18.2 mL 5  . Magnesium 100 MG TABS Take by mouth.    . montelukast (SINGULAIR) 10 MG tablet Take 1 tablet at night for coughing or wheezing. 90 tablet 3  . Multiple Vitamin (MULTIVITAMIN) capsule Take 1 capsule by mouth daily.    . Omega-3 Fatty Acids (FISH OIL PO) Take 1 capsule by mouth daily.     Marland Kitchen omeprazole (PRILOSEC) 40 MG capsule Take 1 capsule (40 mg total) by mouth daily as needed. 30 capsule 5  . predniSONE (DELTASONE) 5 MG tablet Take 1 tablet (5 mg total) by mouth daily with breakfast. 120 tablet 3  . simvastatin (ZOCOR) 40 MG tablet Take 40 mg by mouth at bedtime.     . Vitamin E 100 units TABS Take 180 mg by mouth daily.     No current facility-administered medications for this visit.    Allergies:   Other, White oak, Hydrocodone-acetaminophen, and Oxycodone    Social History:  The patient  reports that he has quit smoking. His smoking use included cigarettes. He has never used smokeless tobacco. He reports that he does not drink alcohol and does not use drugs.   Family History:  The patient's family history includes CAD in his father; Lymphoma in his mother; Stroke in his father.    ROS:  Please see the history of present illness.   Otherwise, review of systems are positive for none.   All other systems are reviewed and negative.    PHYSICAL EXAM: VS:  BP 126/74   Pulse 67   Temp 98.2 F (36.8 C)   Ht 6\' 1"  (1.854 m)   Wt 252 lb 3.2 oz (114.4 kg)   SpO2 95%   BMI 33.27 kg/m  , BMI Body mass index is 33.27 kg/m. GENERAL:  Well appearing HEENT: Pupils equal round and reactive, fundi not visualized, oral mucosa unremarkable NECK:  No jugular venous distention, waveform within normal limits, carotid upstroke brisk and symmetric, no bruits, LUNGS:  Clear to auscultation bilaterally HEART:  RRR.  PMI not displaced or sustained,S1 and  S2 within normal limits, no S3, no S4, no clicks, no rubs, no murmurs ABD:  Flat, positive bowel sounds normal in frequency in pitch, no bruits, no rebound, no guarding, no midline pulsatile mass, no hepatomegaly, no splenomegaly EXT:  2 plus pulses throughout, no edema, no cyanosis no clubbing SKIN:  No rashes no nodules NEURO:  Cranial nerves II through XII grossly intact, motor grossly intact throughout PSYCH:  Cognitively intact, oriented to person place and time  EKG:  EKG is ordered today. The ekg ordered 07/12/2015 demonstrates  sinus rhythm. Rate 69 bpm. 12/11/2017: Sinus rhythm.  Rate 83 bpm. 03/24/2020: Sinus rhythm. Rate 67 bpm.   ETT 07/27/15:  Blood pressure demonstrated a hypotensive response to exercise.  Upsloping ST segment depression ST segment depression was noted during stress in the II, III, aVF, V4, V5 and V6 leads.  Excellent exercise capacity.  Negative, adequate stress test.  Echo 12/12/17: Study Conclusions  - Left ventricle: The cavity size was normal. Wall thickness was   increased in a pattern of mild LVH. Systolic function was normal.   The estimated ejection fraction was in the range of 55% to 60%.   Wall motion was normal; there were no regional wall motion   abnormalities. Doppler parameters are consistent with abnormal   left ventricular relaxation (grade 1 diastolic dysfunction).  Impressions:  - Normal LV function; mild LVH; mild diastolic dysfunction.  Recent Labs: No results found for requested labs within last 8760 hours.    Lipid Panel    Component Value Date/Time   CHOL 225 (H) 07/27/2015 0752   TRIG 65 07/27/2015 0752   HDL 69 07/27/2015 0752   CHOLHDL 3.3 07/27/2015 0752   LDLCALC 143 (H) 07/27/2015 0752      Wt Readings from Last 3 Encounters:  03/24/20 252 lb 3.2 oz (114.4 kg)  06/19/19 261 lb (118.4 kg)  05/15/19 255 lb (115.7 kg)      ASSESSMENT AND PLAN:  # Atypical chest pain: Resolved.  He did not get his  coronary CT-A due to lack of insurance coverage.  ETT was nehative 07/2015. He did have 1 episode of chest pain that occurred with rest. However he has not had any recurrent symptoms and he has been very active. Therefore no further ischemia evaluation at this time.  # Shortness of breath: Resolved.  Normal systolic function and grade 1 diastolic dysfunction on echo.  # Hypertension:  # Orthostatic symptoms: Orthostatic symptoms have improved.  He no longer has hypertension since he was diagnosed with adrenal insufficiency and is not taking any antihypertensive medications.  # Hyperlipidemia: Continue simvastatin. LDL 89 on 12/2019.  # OSA: Patient was encouraged to wear his CPAP whenever sleeping.    Current medicines are reviewed at length with the patient today.  The patient does not have concerns regarding medicines.  The following changes have been made:  no change  Labs/ tests ordered today include:   Orders Placed This Encounter  Procedures  . EKG 12-Lead     Disposition:   FU with Kolby Schara C. Duke Salvia, MD, [facility] in 1 year.    Signed, Demiyah Fischbach C. Duke Salvia, MD, [facility]  03/24/2020 11:04 AM    [facility]

## 2020-03-24 NOTE — Patient Instructions (Signed)
Medication Instructions:  Your physician recommends that you continue on your current medications as directed. Please refer to the Current Medication list given to you today.  *If you need a refill on your cardiac medications before your next appointment, please call your pharmacy*  Lab Work: NONE  Testing/Procedures: NONE  Follow-Up: At [facility], you and your health needs are our priority.  As part of our continuing mission to provide you with exceptional heart care, we have created designated Provider Care Teams.  These Care Teams include your primary Cardiologist (physician) and Advanced Practice Providers (APPs -  Physician Assistants and Nurse Practitioners) who all work together to provide you with the care you need, when you need it.  We recommend signing up for the patient portal called "MyChart".  Sign up information is provided on this After Visit Summary.  MyChart is used to connect with patients for Virtual Visits (Telemedicine).  Patients are able to view lab/test results, encounter notes, upcoming appointments, etc.  Non-urgent messages can be sent to your provider as well.   To learn more about what you can do with MyChart, go to [email or web address].    Your next appointment:   12 month(s)  You will receive a reminder letter in the mail two months in advance. If you don't receive a letter, please call our office to schedule the follow-up appointment.  The format for your next appointment:   In Person  Provider:   You may see DR  or one of the following Advanced Practice Providers on your designated Care Team:    Luke Kilroy, PA-C  Callie Goodrich, PA-C  Jesse Cleaver, FNP     

## 2020-03-31 ENCOUNTER — Encounter: Payer: Self-pay | Admitting: Internal Medicine

## 2020-03-31 ENCOUNTER — Ambulatory Visit (INDEPENDENT_AMBULATORY_CARE_PROVIDER_SITE_OTHER): Payer: Managed Care, Other (non HMO) | Admitting: Internal Medicine

## 2020-03-31 ENCOUNTER — Telehealth: Payer: Self-pay | Admitting: Internal Medicine

## 2020-03-31 ENCOUNTER — Other Ambulatory Visit: Payer: Self-pay

## 2020-03-31 VITALS — BP 130/74 | HR 61 | Ht 73.0 in | Wt 245.5 lb

## 2020-03-31 DIAGNOSIS — E271 Primary adrenocortical insufficiency: Secondary | ICD-10-CM | POA: Diagnosis not present

## 2020-03-31 MED ORDER — FLUDROCORTISONE ACETATE 0.1 MG PO TABS: 0.1000 mg | ORAL_TABLET | Freq: Every day | ORAL | 3 refills | Status: DC

## 2020-03-31 MED ORDER — PREDNISONE 5 MG PO TABS: 5.0000 mg | ORAL_TABLET | Freq: Every day | ORAL | 3 refills | Status: DC

## 2020-03-31 NOTE — Telephone Encounter (Signed)
Please note that patient wants his pharmacy of record to reflect:  [facility]  [address]  [place] [address]   One time RX can be sent to local pharmacy per patient.   Please resend Fludrocortisone and Prednisone to [facility]

## 2020-03-31 NOTE — Telephone Encounter (Signed)
Patient have been notified.  

## 2020-03-31 NOTE — Patient Instructions (Signed)
-  Continue Prednisone 5 mg daily with breakfast  - Continue Florinef 0.1 mg daily     ADRENAL INSUFFICIENCY SICK DAY RULES:  Should you face an extreme emotional or physical stress such as trauma, surgery or acute illness, this will require extra steroid coverage so that the body can meet that stress.   Without increasing the steroid dose you may experience severe weakness, headache, dizziness, nausea and vomiting and possibly a more serious deterioration in health.  Typically the dose of steroids will only need to be increased for a couple of days if you have an illness that is transient and managed in the community.   If you are unable to take/absorb an increased dose of steroids orally because of vomiting or diarrhea, you will urgently require steroid injections and should present to an Emergency Department.  The general advice for any serious illness is as follows: 1. Double the normal daily steroid dose for up to 3 days if you have a temperature of more than 37.50C (99.50F) with signs of sickness, or severe emotional or physical distress 2. Contact your primary care doctor and Endocrinologist if the illness worsens or it lasts for more than 3 days.  3. In cases of severe illness, urgent medical assistance should be promptly sought. 4. If you experience vomiting/diarrhea or are unable to take steroids by mouth, please administer the Hydrocortisone injection kit and seek urgent medical help.    

## 2020-03-31 NOTE — Telephone Encounter (Signed)
Noted. Both Rx have already been sent to [facility] by Dr Lonzo Cloud

## 2020-03-31 NOTE — Progress Notes (Signed)
Name: Nicholas Coleman  MRN/ DOB: [id number], 1961/03/11    Age/ Sex: 60 y.o., male     PCP: Wilfrid Lund, PA   Reason for Endocrinology Evaluation: Adrenal insufficiency     Initial Endocrinology Clinic Visit: 03/11/2018    PATIENT IDENTIFIER: Nicholas Coleman is a 60 y.o., male with a past medical history of HTN, Dyslipidemia and OSA. He has followed with [facility] since 03/12/2019 for consultative assistance with management of his adrenal insufficiency.   HISTORICAL SUMMARY: The patient was first diagnosed with primary adrenal insufficiency in 01/2018 after presenting to the ED with hypotension, nausea and lightheadedness. IN the ED, he was noted with hyponatremia with a serum sodium Nadir of 117 mEq/L and high normal K= of 5.0 mEq/L .  A cosyntropin Stimulation test was abnormal 60- minute cortisol level of 3.9 ug/dL, with a high ACTH  Level 363.0 pg/mL   He was discharged on HC 15 mg QAM and 10 mg QPM and florinef.  No FH of thyroid  Or autoimmune disease, that he is aware of.    SUBJECTIVE:   During last visit (03/18/2019): Continued prednisone 5 mg with breakfast and increased florinef to 0.1 mg daily   Today (12/09/2018):  Nicholas Coleman is here for a  follow up on primary adrenal insufficiency. He is compliant with prednisone dose . He is on a diet and has lost weight.   Denies dizziness, denies fatigue  Has normal bowel movement.     Endocrine Medications  Prednisone 5 mg daily  Fludrocortisone 0.1 mg daily      HISTORY:  Past Medical History:  Past Medical History:  Diagnosis Date  . AKI (acute kidney injury) (HCC) 02/05/2018  . Atypical chest pain 12/11/2017  . BPH (benign prostatic hyperplasia) 07/12/2015  . Essential hypertension 07/12/2015  . Exertional dyspnea 12/11/2017  . Hyperlipidemia   . Hypertension   . Mild persistent asthma 06/19/2019  . Obesity (BMI 30.0-34.9) 03/24/2020  . OSA on CPAP 03/24/2020    Past Surgical History: No  past surgical history on file.  Social History:  reports that he has quit smoking. His smoking use included cigarettes. He has never used smokeless tobacco. He reports that he does not drink alcohol and does not use drugs. Family History:  Family History  Problem Relation Age of Onset  . Lymphoma Mother   . Stroke Father   . CAD Father        cabg  . Angioedema Neg Hx   . Allergic rhinitis Neg Hx   . Asthma Neg Hx   . Eczema Neg Hx   . Immunodeficiency Neg Hx   . Urticaria Neg Hx      HOME MEDICATIONS: Allergies as of 03/31/2020      Reactions   Other Cough   Cat and dog dander cough and sinus build up   [facility] Other (See Comments)   Cough and sinus build up   Hydrocodone-acetaminophen Anxiety, Other (See Comments)   Insomnia, "jittery"   Oxycodone Anxiety, Other (See Comments)   Insomnia, "jittery"      Medication List       Accurate as of March 31, 2020 10:00 AM. If you have any questions, ask your nurse or doctor.        albuterol 108 (90 Base) MCG/ACT inhaler Commonly known as: ProAir HFA Inhale 2 puffs into the lungs every 4 (four) hours as needed for wheezing or shortness of breath.   alfuzosin 10  MG 24 hr tablet Commonly known as: UROXATRAL Take 10 mg by mouth at bedtime.   buPROPion 150 MG 24 hr tablet Commonly known as: WELLBUTRIN XL Take 150 mg by mouth daily.   calcium-vitamin D 500-200 MG-UNIT tablet Commonly known as: OSCAL WITH D Take 1 tablet by mouth.   cetirizine 10 MG tablet Commonly known as: ZYRTEC Take 1 tablet (10 mg total) by mouth daily as needed for allergies.   co-enzyme Q-10 30 MG capsule Take 30 mg by mouth daily.   FISH OIL PO Take 1 capsule by mouth daily.   fludrocortisone 0.1 MG tablet Commonly known as: FLORINEF Take 1 tablet (0.1 mg total) by mouth daily.   fluticasone 50 MCG/ACT nasal spray Commonly known as: FLONASE 2 sprays per nostril at night   Magnesium 100 MG Tabs Take by mouth.   montelukast 10  MG tablet Commonly known as: Singulair Take 1 tablet at night for coughing or wheezing.   multivitamin capsule Take 1 capsule by mouth daily.   omeprazole 40 MG capsule Commonly known as: PRILOSEC Take 1 capsule (40 mg total) by mouth daily as needed.   predniSONE 5 MG tablet Commonly known as: DELTASONE Take 1 tablet (5 mg total) by mouth daily with breakfast.   simvastatin 40 MG tablet Commonly known as: ZOCOR Take 40 mg by mouth at bedtime.   vitamin C 100 MG tablet Take 100 mg by mouth daily.   Vitamin E 100 units Tabs Take 180 mg by mouth daily.         OBJECTIVE:   PHYSICAL EXAM: VS: BP 130/74   Pulse 61   Ht 6\' 1"  (1.854 m)   Wt 245 lb 8 oz (111.4 kg)   SpO2 98%   BMI 32.39 kg/m    Body surface area is 2.4 meters squared.   EXAM: General: Pt appears well and is in NAD  Neck: General: Supple without adenopathy. Thyroid: Thyroid size normal.  No goiter or nodules appreciated.   Lungs: Clear with good BS bilat with no rales, rhonchi, or wheezes  Heart: Auscultation: RRR.  Abdomen: Normoactive bowel sounds, soft, nontender, without masses or organomegaly palpable  Extremities:  BL LE: No pretibial edema normal ROM and strength.  Mental Status: Judgment, insight: Intact Mood and affect: No depression, anxiety, or agitation     DATA REVIEWED:  Results for Nicholas Coleman, Nicholas Coleman (MRN Mardee Postin) as of 06/10/2018 12:41  Ref. Range 02/06/2018 06:28  Cortisol, Base Latest Units: ug/dL 3.8  Cortisol, 30 Min Latest Units: ug/dL 4.2  Cortisol, 60 Min Latest Units: ug/dL 3.9   Results for Nicholas Coleman, Nicholas Coleman (MRN Mardee Postin) as of 06/10/2018 12:41  Ref. Range 02/06/2018 16:05 02/07/2018 07:00 03/11/2018 09:18  C206 ACTH Latest Ref Range: 6 - 50 pg/mL 363.0 (H) 423.0 (H) 839 (H)    01/08/2020 BUN/Cr 22/1.1  Gluc 79 mg/dL   ASSESSMENT / PLAN / RECOMMENDATIONS:   1. Primary Adrenal Insufficiency :  - Unclear cause, most likely autoimmune, iron studies were  unremarkable, CT scan was normal. - Pt has a medical alert bracelet - Repeat BMP at PCP's office was normal ( 12/2019) - We again reviewed the sick day rule.   Medications   Continue Prednisone 5 mg, 1 tablet with Breakfast   Continue Florinef  0.1 mg, 1 tablet  daily     F/u in 1 yr    Signed electronically by: 01/2020, MD  [facility] End[facility]  [facility] [address] [place]  [address] [place], [place] [address] Phone: [phone] FAX: [phone]      CC: Wilfrid Lund, PA [address] [place] [address] Phone: [phone]  Fax: [phone]   Return to Endocrinology clinic as below: Future Appointments  Date Time Provider Department Center  03/31/2020 10:10 AM Clemencia Helzer, Konrad Dolores, MD [facility] None  11/04/2020 10:30 AM Bobbitt, Heywood Iles, MD [facility] None

## 2020-04-26 ENCOUNTER — Other Ambulatory Visit: Payer: Self-pay | Admitting: Internal Medicine

## 2020-05-19 ENCOUNTER — Other Ambulatory Visit: Payer: Self-pay | Admitting: Family Medicine

## 2020-06-15 ENCOUNTER — Telehealth: Payer: Self-pay

## 2020-06-15 NOTE — Telephone Encounter (Signed)
That is fine - please send in four months of refills to get him for his annual visit. We are stretched thin with regards to physicians/NPs, so we will emphasize the need for more regular visits in August.   Malachi Bonds, MD [facility]

## 2020-06-15 NOTE — Telephone Encounter (Signed)
Called patient to let him know he was due for a 6 month follow up for his mild intermittent asthma before we can give any refills on medications. Patient stated that "he does not have asthma and the doctor agreed with him". Advised him that we cannot give refills until an appointment is made unless we got it approved from a doctor. He last saw Dr. Nunzio Cobbs and has a follow visit with you in August. Patient refused to make a 6 month follow up but is willing to keep his yearly appointment. Please advise.

## 2020-06-16 MED ORDER — FLUTICASONE PROPIONATE 50 MCG/ACT NA SUSP: NASAL | 3 refills | Status: DC

## 2020-06-16 MED ORDER — MONTELUKAST SODIUM 10 MG PO TABS: ORAL_TABLET | ORAL | 0 refills | Status: DC

## 2020-06-16 NOTE — Telephone Encounter (Signed)
Refills for montelukast 10 mg and fluticasone sent to [facility] pharmacy x 1 with 3 refills. OK per Dr. Dellis Anes

## 2020-06-16 NOTE — Addendum Note (Signed)
Addended by: Janie Morning on: 06/16/2020 02:28 PM   Modules accepted: Orders

## 2020-06-28 ENCOUNTER — Other Ambulatory Visit: Payer: Self-pay

## 2020-06-28 MED ORDER — MONTELUKAST SODIUM 10 MG PO TABS: ORAL_TABLET | ORAL | 0 refills | Status: DC

## 2020-09-16 ENCOUNTER — Ambulatory Visit: Payer: Managed Care, Other (non HMO) | Admitting: Allergy & Immunology

## 2020-09-23 ENCOUNTER — Ambulatory Visit (INDEPENDENT_AMBULATORY_CARE_PROVIDER_SITE_OTHER): Payer: Managed Care, Other (non HMO) | Admitting: Allergy & Immunology

## 2020-09-23 ENCOUNTER — Ambulatory Visit: Payer: Managed Care, Other (non HMO) | Admitting: Family

## 2020-09-23 ENCOUNTER — Other Ambulatory Visit: Payer: Self-pay

## 2020-09-23 ENCOUNTER — Encounter: Payer: Self-pay | Admitting: Allergy & Immunology

## 2020-09-23 VITALS — BP 128/80 | HR 68 | Temp 98.6°F | Resp 20 | Ht 73.0 in | Wt 235.6 lb

## 2020-09-23 DIAGNOSIS — J4520 Mild intermittent asthma, uncomplicated: Secondary | ICD-10-CM

## 2020-09-23 DIAGNOSIS — K219 Gastro-esophageal reflux disease without esophagitis: Secondary | ICD-10-CM

## 2020-09-23 DIAGNOSIS — J3089 Other allergic rhinitis: Secondary | ICD-10-CM

## 2020-09-23 MED ORDER — MONTELUKAST SODIUM 10 MG PO TABS: ORAL_TABLET | ORAL | 3 refills | Status: DC

## 2020-09-23 NOTE — Patient Instructions (Addendum)
1. Mild intermittent asthma without complication - We are removing this from your diagnosis list.  2. Allergic rhinitis - Continue with the montelukast 10mg  daily. - I think it is good to stop the Flonase since you are reacting adversely to it.   3. Gastroesophageal reflux disease - Continue with omeprazole as needed.  - It seems that the diet has helped quite a bit.  4. Return in about 1 year (around 09/23/2021).    Please inform 09/25/2021 of any Emergency Department visits, hospitalizations, or changes in symptoms. Call us before going to the ED for breathing or allergy symptoms since we might be able to fit you in for a sick visit. Feel free to contact us anytime with any questions, problems, or concerns.  It was a pleasure to meet you today!  Websites that have reliable patient information: 1. American Academy of Asthma, Allergy, and Immunology: www.aaaai.org 2. Food Allergy Research and Education (FARE): foodallergy.org 3. Mothers of Asthmatics: http://www.asthmacommunitynetwork.org 4. American College of Allergy, Asthma, and Immunology: www.acaai.org   COVID-19 Vaccine Information can be found at: [place] For questions related to vaccine distribution or appointments, please email [email or web address] or call [phone].   We realize that you might be concerned about having an allergic reaction to the COVID19 vaccines. To help with that concern, WE ARE OFFERING THE COVID19 VACCINES IN OUR OFFICE! Ask the front desk for dates!     "Like" [phone] on Facebook and Instagram for our latest updates!      A healthy democracy works best when [place] participate! Make sure you are registered to vote! If you have moved or changed any of your contact information, you will need to get this updated before voting!  In some cases, you MAY be able to register to vote online:  [employer]

## 2020-09-23 NOTE — Progress Notes (Signed)
FOLLOW UP  Date of Service/Encounter:  09/23/20   Assessment:   Intermittent asthma, uncomplicated  Allergic rhinitis  GERD  Plan/Recommendations:   1. Mild intermittent asthma without complication - We are removing this from your diagnosis list.  2. Allergic rhinitis - Continue with the montelukast 10mg  daily. - I think it is good to stop the Flonase since you are reacting adversely to it.   3. Gastroesophageal reflux disease - Continue with omeprazole as needed.  - It seems that the diet has helped quite a bit.  4. Return in about 1 year (around 09/23/2021).    Subjective:   Nicholas Coleman is a 60 y.o. male presenting today for follow up of  Chief Complaint  Patient presents with   Follow-up    Pt states overall health well managed. Occasional sneezing and coughing but nothing alarming.   Cough   Asthma   Allergic Rhinitis     Nicholas Coleman has a history of the following: Patient Active Problem List   Diagnosis Date Noted   Primary adrenal insufficiency (HCC) 03/31/2020   Obesity (BMI 30.0-34.9) 03/24/2020   OSA on CPAP 03/24/2020   Allergic rhinitis 06/19/2019   Allergic conjunctivitis 06/19/2019   Mild intermittent asthma 06/19/2019   GERD (gastroesophageal reflux disease) 06/19/2019   Renal cyst 04/27/2019   Hyperkalemia    Adrenal insufficiency (HCC)    CKD (chronic kidney disease), stage III (HCC)    Hyponatremia 02/05/2018   Hypochloremia 02/05/2018   AKI (acute kidney injury) (HCC) 02/05/2018   Chronic abdominal pain 02/05/2018   Hypotension 02/04/2018   Atypical chest pain 12/11/2017   Exertional dyspnea 12/11/2017   Grade III hemorrhoids 12/22/2015   Delayed ejaculation, acquired, situational, moderate 11/22/2015   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  Nicholas Coleman is a 60 y.o. male presenting for a follow up visit.  He was last seen in August 2021.  At that time, his asthma was under good control.  His Singulair was discontinued and he was continued on albuterol as needed.  For his allergic rhinitis, he was continued on Flonase as well as cetirizine.  We also continue with Pataday as needed.  His reflux was under control with omeprazole daily.  Since last visit, he has done well.   Asthma/Respiratory Symptom History: He has not used his albuterol in over one year. He tells me that he does not have asthma.  He does have a CPAP but he does not have any problems with this.   Allergic Rhinitis Symptom History: He is on the cetirizine every day. He was trying to take  He has not needed antibiotics for sinus infections. He is doing overall very well.   He does not have food allergies. He has been tested for "inflammatory" foods. He avoids wheat and dairy and corn. He has joint issues when he takes these foods, so avoiding them improves his quality of life. He is seeing a nutritionist (September 2021 in [place] off of [address].   He works in [address]. He seems to enjoy this job.   Otherwise, there have been no changes to his past medical history, surgical history, family history, or social history.    Review of Systems  Constitutional: Negative.  Negative for chills, fever, malaise/fatigue and weight loss.  HENT:  Negative for congestion, ear  discharge, ear pain and sinus pain.   Eyes:  Negative for pain, discharge and redness.  Respiratory:  Negative for cough, sputum production, shortness of breath and wheezing.   Cardiovascular: Negative.  Negative for chest pain and palpitations.  Gastrointestinal:  Negative for abdominal pain, constipation, diarrhea, heartburn, nausea and vomiting.  Skin: Negative.  Negative for itching and rash.  Neurological:  Negative for dizziness and headaches.  Endo/Heme/Allergies:   Positive for environmental allergies. Does not bruise/bleed easily.      Objective:   Blood pressure 128/80, pulse 68, temperature 98.6 F (37 C), temperature source Temporal, resp. rate 20, height 6\' 1"  (1.854 m), weight 235 lb 9.6 oz (106.9 kg), SpO2 96 %. Body mass index is 31.08 kg/m.   Physical Exam:  Physical Exam Constitutional:      Appearance: He is well-developed.     Comments: Very talkative. Cooperative with the exam.   HENT:     Head: Normocephalic and atraumatic.     Right Ear: Tympanic membrane, ear canal and external ear normal.     Left Ear: Tympanic membrane, ear canal and external ear normal.     Nose: No nasal deformity, septal deviation, mucosal edema or rhinorrhea.     Right Turbinates: Not enlarged or swollen.     Left Turbinates: Not enlarged or swollen.     Right Sinus: No maxillary sinus tenderness or frontal sinus tenderness.     Left Sinus: No maxillary sinus tenderness or frontal sinus tenderness.     Mouth/Throat:     Mouth: Mucous membranes are not pale and not dry.     Pharynx: Uvula midline.  Eyes:     General: Lids are normal. No allergic shiner.       Right eye: No discharge.        Left eye: No discharge.     Conjunctiva/sclera: Conjunctivae normal.     Right eye: Right conjunctiva is not injected. No chemosis.    Left eye: Left conjunctiva is not injected. No chemosis.    Pupils: Pupils are equal, round, and reactive to light.  Cardiovascular:     Rate and Rhythm: Normal rate and regular rhythm.     Heart sounds: Normal heart sounds.  Pulmonary:     Effort: Pulmonary effort is normal. No tachypnea, accessory muscle usage or respiratory distress.     Breath sounds: Normal breath sounds. No wheezing, rhonchi or rales.     Comments: Moving air well in all lung fields. Chest:     Chest wall: No tenderness.  Lymphadenopathy:     Cervical: No cervical adenopathy.  Skin:    General: Skin is warm.     Capillary Refill: Capillary refill  takes less than 2 seconds.     Coloration: Skin is not pale.     Findings: No abrasion, erythema, petechiae or rash. Rash is not papular, urticarial or vesicular.     Comments: No eczematous or urticarial lesions noted.  Neurological:     Mental Status: He is alert.  Psychiatric:        Behavior: Behavior is cooperative.     Diagnostic studies: none        , MD  [facility]

## 2020-10-19 IMAGING — CR DG LUMBAR SPINE 2-3V
3 series · 3 of 3 positions shown · non-contrast
Comparison: None.

CLINICAL DATA: Back stiffness or several months without known
injury.

EXAM:
LUMBAR SPINE - 2-3 VIEW

[t l-spine a.p.]
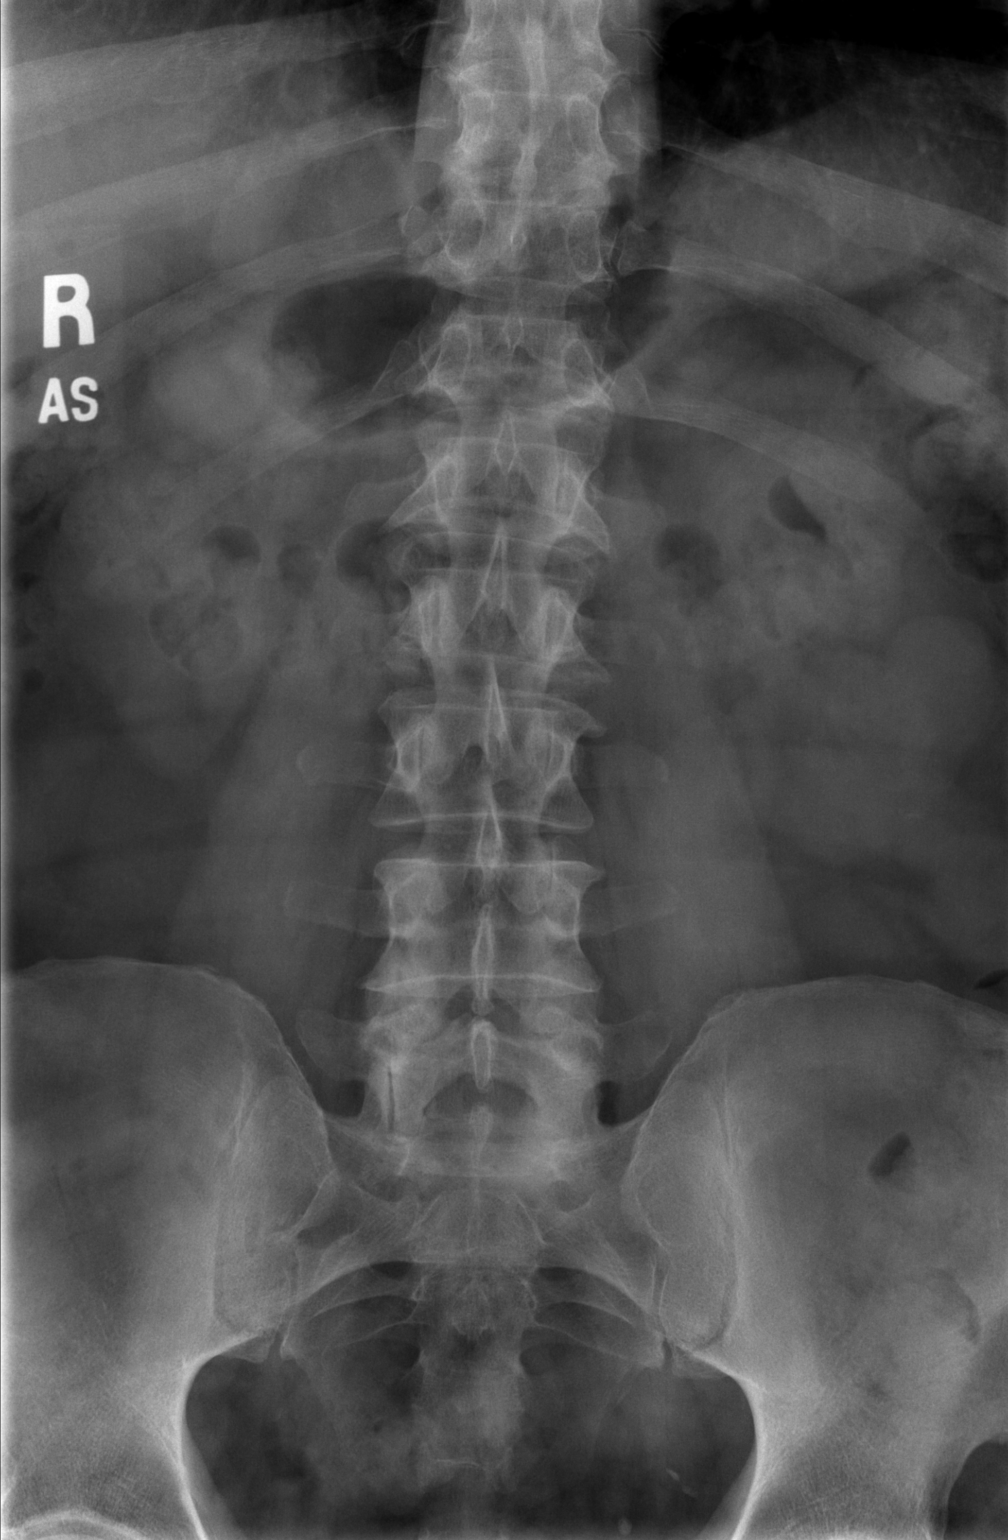

[t l-spine lat]
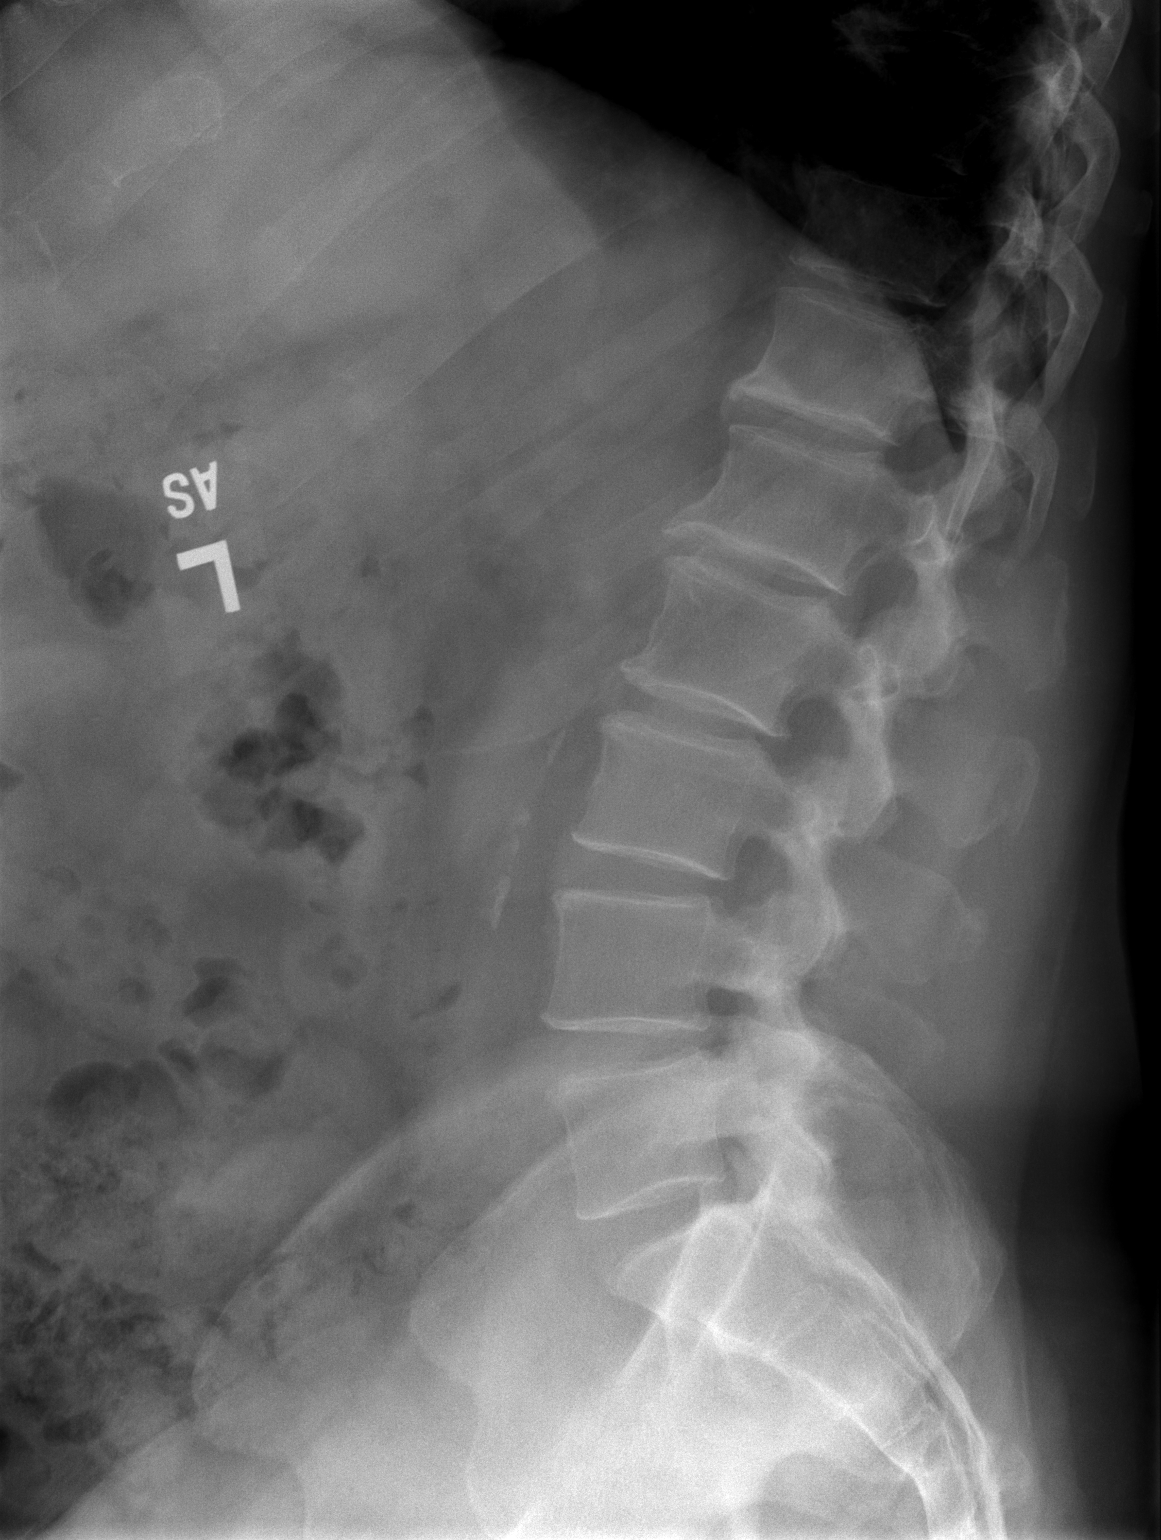

[t l-spine l5-s1 spot]
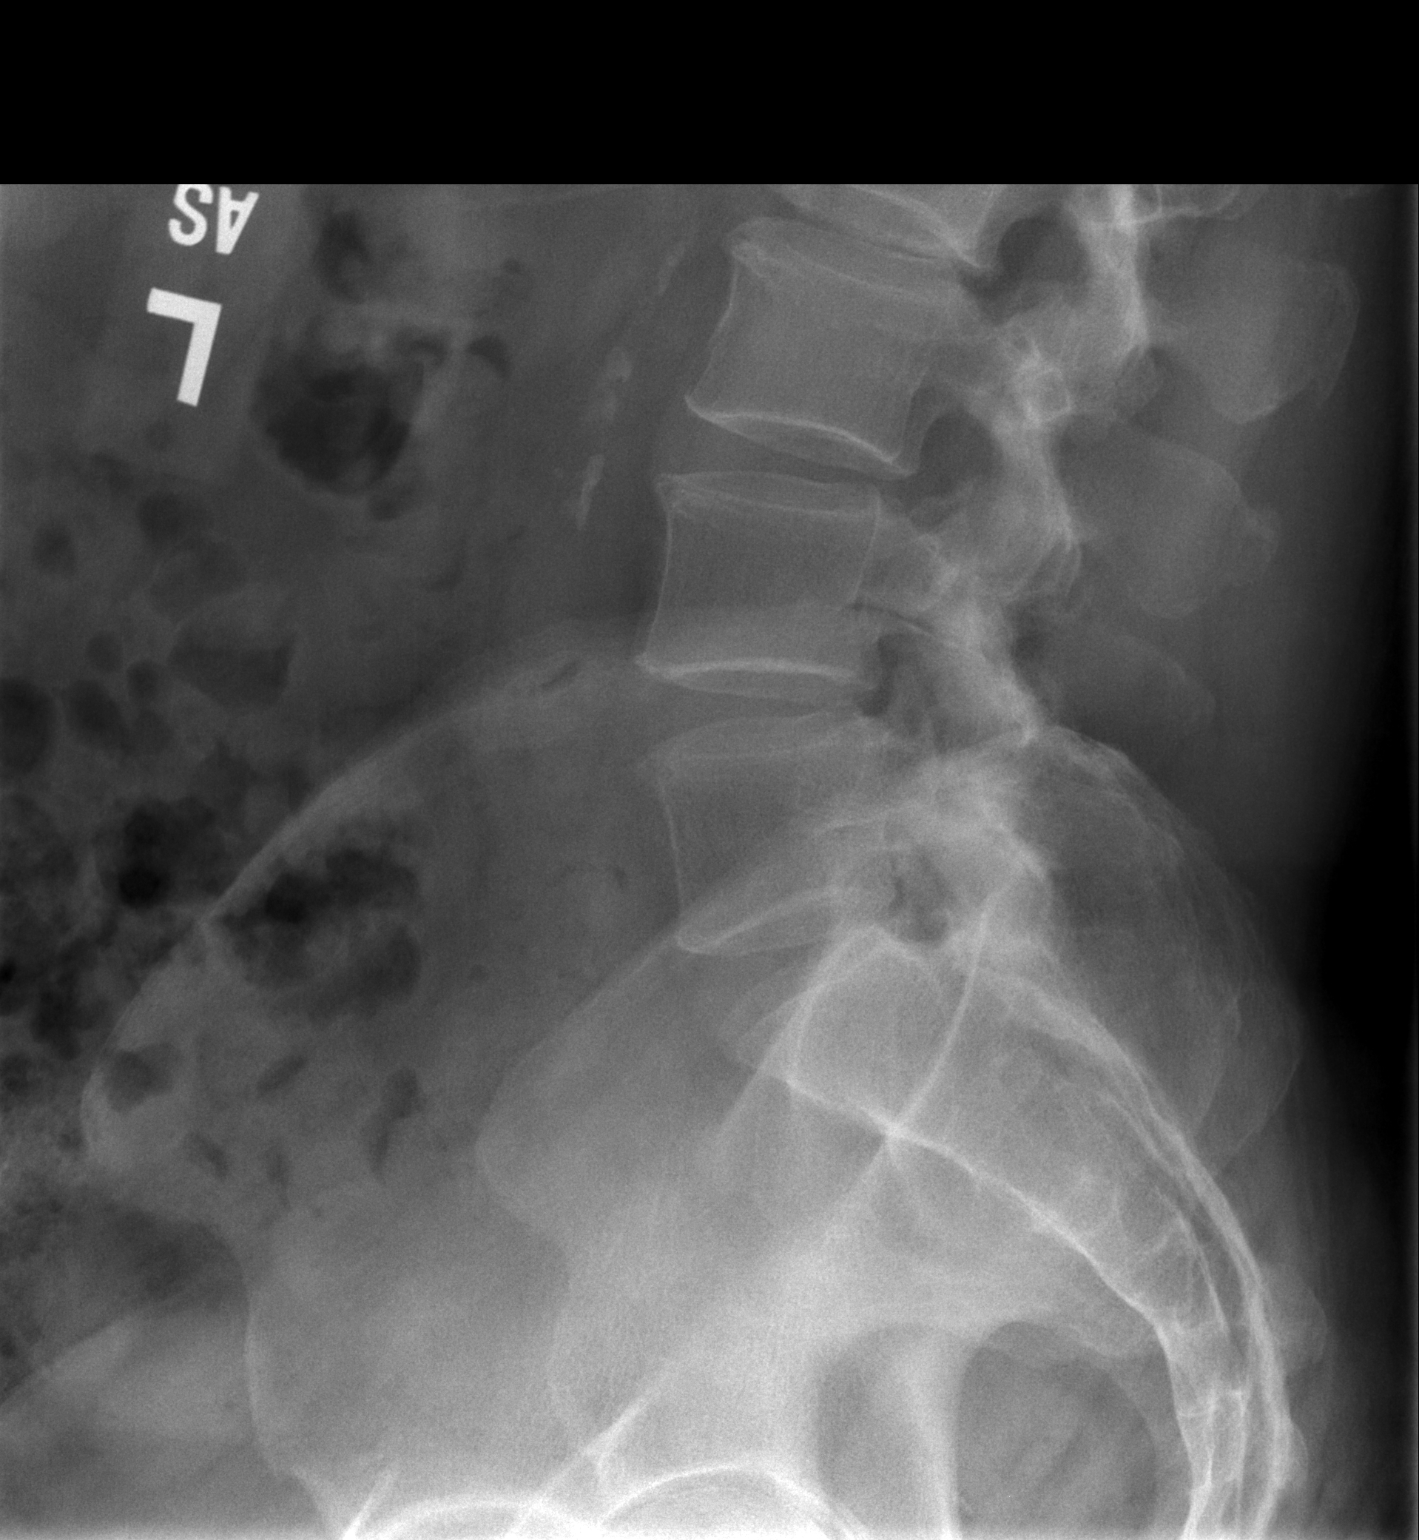

[3 of 3 positions shown; findings below may reference images not displayed]

FINDINGS: No fracture or spondylolisthesis is noted. Mild degenerative disc
disease is noted at L1-2 and L2-3 with anterior osteophyte
formation. Remaining disc spaces are unremarkable.
IMPRESSION: Mild multilevel degenerative disc disease. No acute abnormality seen
in the lumbar spine.

## 2020-10-19 IMAGING — CR DG HAND 2V*R*
2 series · 2 of 2 positions shown · non-contrast
Comparison: No prior.

CLINICAL DATA: Right hand pain with stiffness.

EXAM:
RIGHT HAND - 2 VIEW

[x hand pa right]
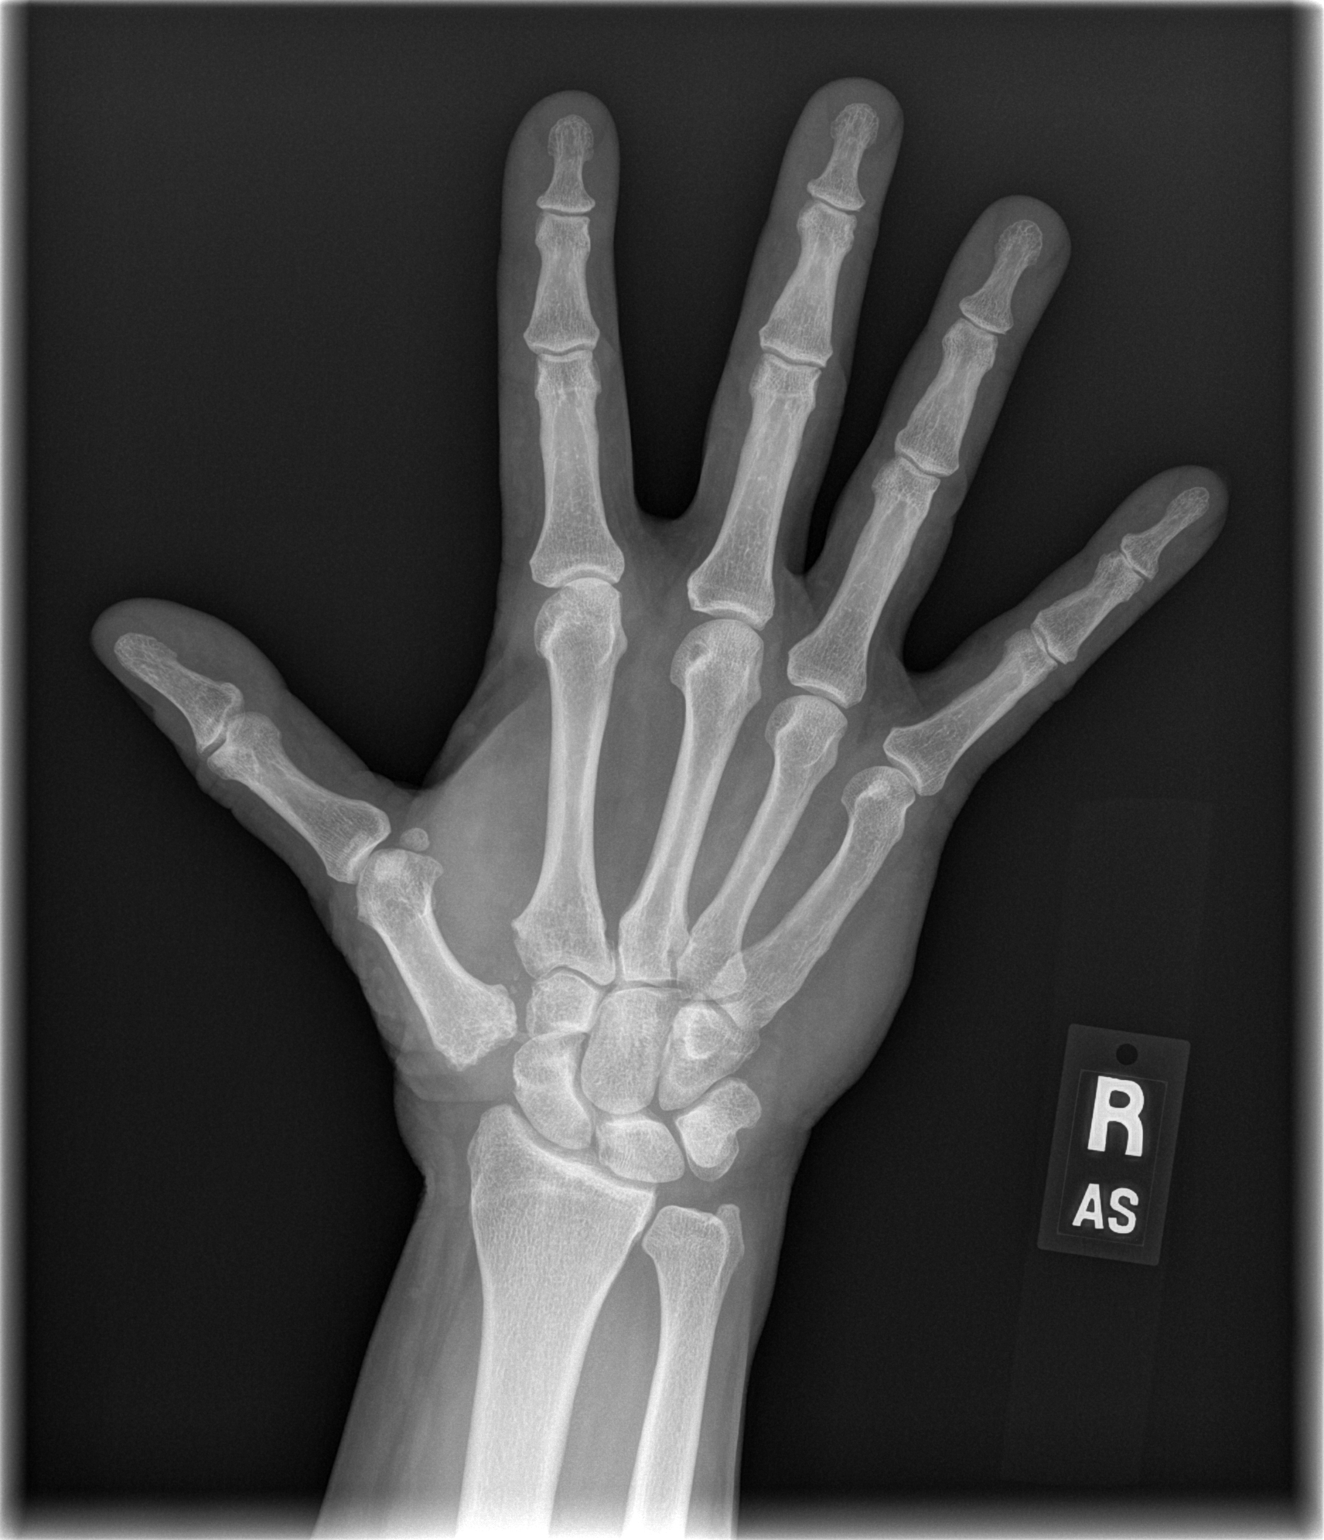

[x hand lat right]
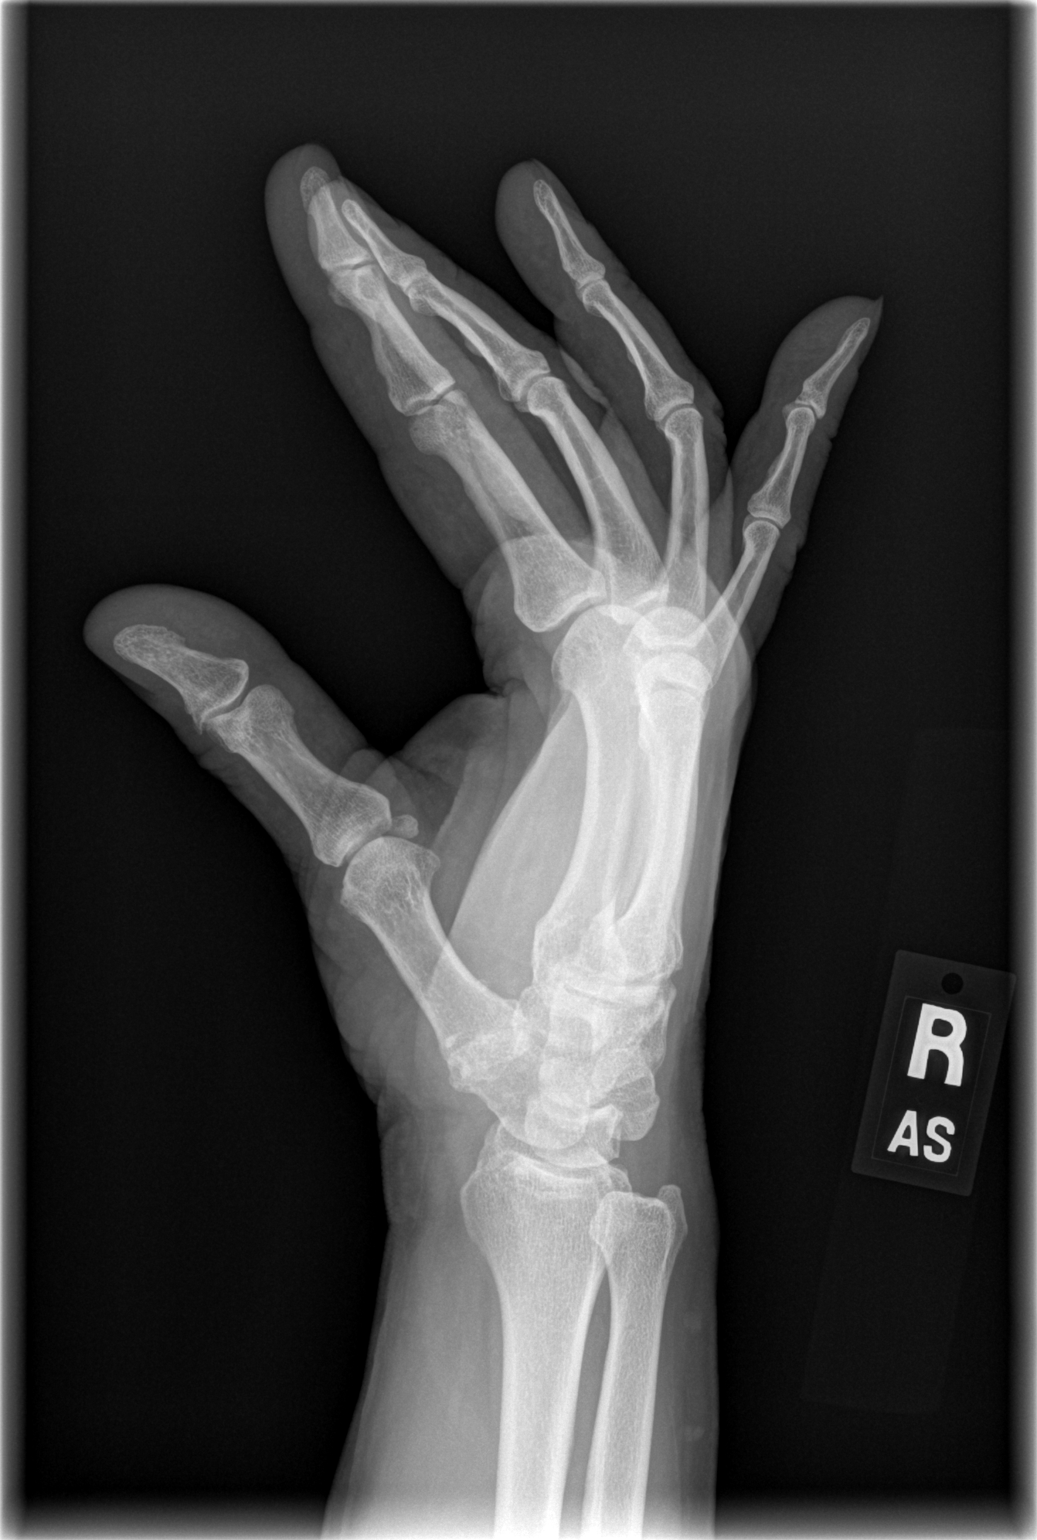

[2 of 2 positions shown; findings below may reference images not displayed]

FINDINGS: The trapezium is not visualized, most likely from prior resection.
Small corticated osseous debris noted at the site of the trapezium,
this is most likely postsurgical. Degenerative changes noted the
base of the first metacarpal. Mild diffuse degenerative change.
Small corticated cyst noted along the distal aspect of the right
third metacarpal, most likely degenerative. No acute bony
abnormality identified. No evidence of fracture or dislocation.
IMPRESSION: 1. The trapezium is not visualized, most likely from prior
resection. Small corticated osseous debris noted the site of
trapezium, this is most likely postsurgical. Degenerative changes
noted the base of the first metacarpal. No acute bony abnormality
identified.

2.  Mild diffuse degenerative change.

## 2020-11-04 ENCOUNTER — Ambulatory Visit: Payer: Managed Care, Other (non HMO) | Admitting: Allergy & Immunology

## 2020-11-04 ENCOUNTER — Ambulatory Visit: Payer: Managed Care, Other (non HMO) | Admitting: Allergy and Immunology

## 2021-03-28 ENCOUNTER — Other Ambulatory Visit: Payer: Self-pay | Admitting: Internal Medicine

## 2021-04-04 ENCOUNTER — Other Ambulatory Visit: Payer: Self-pay | Admitting: Internal Medicine

## 2021-04-06 ENCOUNTER — Other Ambulatory Visit: Payer: Self-pay

## 2021-04-06 ENCOUNTER — Ambulatory Visit (INDEPENDENT_AMBULATORY_CARE_PROVIDER_SITE_OTHER): Payer: Managed Care, Other (non HMO) | Admitting: Internal Medicine

## 2021-04-06 ENCOUNTER — Encounter: Payer: Self-pay | Admitting: Internal Medicine

## 2021-04-06 VITALS — BP 124/82 | HR 61 | Ht 73.0 in | Wt 238.0 lb

## 2021-04-06 DIAGNOSIS — E271 Primary adrenocortical insufficiency: Secondary | ICD-10-CM | POA: Diagnosis not present

## 2021-04-06 MED ORDER — PREDNISONE 5 MG PO TABS: 5.0000 mg | ORAL_TABLET | Freq: Every day | ORAL | 3 refills | Status: DC

## 2021-04-06 MED ORDER — FLUDROCORTISONE ACETATE 0.1 MG PO TABS: 100.0000 ug | ORAL_TABLET | Freq: Every day | ORAL | 3 refills | Status: DC

## 2021-04-06 NOTE — Progress Notes (Signed)
Name: Nicholas Coleman  MRN/ DOB: [id number], 1960-04-14    Age/ Sex: 61 y.o., male     PCP: Wilfrid Lund, PA   Reason for Endocrinology Evaluation: Adrenal insufficiency     Initial Endocrinology Clinic Visit: 03/11/2018    PATIENT IDENTIFIER: [name] is a 61 y.o., male with a past medical history of HTN, Dyslipidemia and OSA. He has followed with [facility] since 03/12/2019 for consultative assistance with management of his adrenal insufficiency.   HISTORICAL SUMMARY: The patient was first diagnosed with primary adrenal insufficiency in 01/2018 after presenting to the ED with hypotension, nausea and lightheadedness. IN the ED, he was noted with hyponatremia with a serum sodium Nadir of 117 mEq/L and high normal K= of 5.0 mEq/L .  A cosyntropin Stimulation test was abnormal 60- minute cortisol level of 3.9 ug/dL, with a high ACTH  Level 363.0 pg/mL   He was discharged on HC 15 mg QAM and 10 mg QPM and florinef.  No FH of thyroid  Or autoimmune disease, that he is aware of.    SUBJECTIVE:   During last visit (03/18/2019): Continued prednisone 5 mg with breakfast and continued  florinef to 0.1 mg daily        Today (12/09/2018):  [name] is here for a  follow up on primary adrenal insufficiency. He is compliant with prednisone dose . He is on a diet and has lost weight.   Denies dizziness Energy level stable  Denies constipation or diarrhea  Denies leg swelling  Denies cramps   He has a necklace alert    Endocrine Medications  Prednisone 5 mg daily  Fludrocortisone 0.1 mg daily      HISTORY:  Past Medical History:  Past Medical History:  Diagnosis Date   AKI (acute kidney injury) (HCC) 02/05/2018   Atypical chest pain 12/11/2017   BPH (benign prostatic hyperplasia) 07/12/2015   Essential hypertension 07/12/2015   Exertional dyspnea 12/11/2017   Hyperlipidemia    Hypertension    Mild persistent asthma 06/19/2019   Obesity (BMI  30.0-34.9) 03/24/2020   OSA on CPAP 03/24/2020   Past Surgical History: No past surgical history on file. Social History:  reports that he has quit smoking. His smoking use included cigarettes. He has never used smokeless tobacco. He reports that he does not drink alcohol and does not use drugs. Family History:  Family History  Problem Relation Age of Onset   Lymphoma Mother    Stroke Father    CAD Father        cabg   Angioedema Neg Hx    Allergic rhinitis Neg Hx    Asthma Neg Hx    Eczema Neg Hx    Immunodeficiency Neg Hx    Urticaria Neg Hx      HOME MEDICATIONS: Allergies as of 04/06/2021       Reactions   Other Cough   Cat and dog dander cough and sinus build up   [facility] Other (See Comments)   Cough and sinus build up   Hydrocodone-acetaminophen Anxiety, Other (See Comments)   Insomnia, "jittery"   Oxycodone Anxiety, Other (See Comments)   Insomnia, "jittery"        Medication List        Accurate as of April 06, 2021 11:33 AM. If you have any questions, ask your nurse or doctor.          alfuzosin 10 MG 24 hr tablet Commonly known as:  UROXATRAL Take 10 mg by mouth at bedtime.   buPROPion 150 MG 24 hr tablet Commonly known as: WELLBUTRIN XL Take 150 mg by mouth daily.   calcium-vitamin D 500-200 MG-UNIT tablet Commonly known as: OSCAL WITH D Take 1 tablet by mouth.   cetirizine 10 MG tablet Commonly known as: ZYRTEC Take 1 tablet (10 mg total) by mouth daily as needed for allergies.   co-enzyme Q-10 30 MG capsule Take 30 mg by mouth daily.   finasteride 5 MG tablet Commonly known as: PROSCAR Take 1 tablet by mouth daily.   FISH OIL PO Take 1 capsule by mouth daily.   fludrocortisone 0.1 MG tablet Commonly known as: FLORINEF Take 1 tablet (100 mcg total) by mouth daily.   fluticasone 50 MCG/ACT nasal spray Commonly known as: FLONASE SHAKE LIQUID AND USE 2 SPRAYS IN EACH NOSTRIL AT NIGHT   Glucosamine Sulfate 500 MG Tabs Take by  mouth.   lithium 300 MG tablet Take 300 mg by mouth at bedtime.   Magnesium 100 MG Tabs Take by mouth.   montelukast 10 MG tablet Commonly known as: Singulair Take 1 tablet at night.   multivitamin capsule Take 1 capsule by mouth daily.   omeprazole 40 MG capsule Commonly known as: PRILOSEC Take 1 capsule (40 mg total) by mouth daily as needed.   predniSONE 5 MG tablet Commonly known as: DELTASONE Take 1 tablet (5 mg total) by mouth daily with breakfast. Pt may double up during sick days What changed:  medication strength how much to take additional instructions Changed by: Scarlette ShortsIbtehal J Braedon Sjogren, MD   simvastatin 40 MG tablet Commonly known as: ZOCOR Take 40 mg by mouth at bedtime.   vitamin C 100 MG tablet Take 100 mg by mouth daily.   Vitamin E 100 units Tabs Take 180 mg by mouth daily.          OBJECTIVE:   PHYSICAL EXAM: VS: BP 124/82 (BP Location: Left Arm, Patient Position: Sitting, Cuff Size: Small)    Pulse 61    Ht 6\' 1"  (1.854 m)    Wt 238 lb (108 kg)    SpO2 96%    BMI 31.40 kg/m    Body surface area is 2.36 meters squared.   EXAM: General: Pt appears well and is in NAD  Neck: General: Supple without adenopathy. Thyroid: Thyroid size normal.  No goiter or nodules appreciated.   Lungs: Clear with good BS bilat with no rales, rhonchi, or wheezes  Heart: Auscultation: RRR.  Abdomen: Normoactive bowel sounds, soft, nontender, without masses or organomegaly palpable  Extremities:  BL LE: No pretibial edema normal ROM and strength.  Mental Status: Judgment, insight: Intact Mood and affect: No depression, anxiety, or agitation     DATA REVIEWED: 01/12/2021 BUN/Cr 22/1.3 GFR 62 K 4.2 Ca 8.78 Tg 144 LDL 95 HDL 45    Results for Mardee PostinRUDOLPH, Bion A (MRN [id number]) as of 06/10/2018 12:41  Ref. Range 02/06/2018 06:28  Cortisol, Base Latest Units: ug/dL 3.8  Cortisol, 30 Min Latest Units: ug/dL 4.2  Cortisol, 60 Min Latest Units: ug/dL 3.9    Results for Mardee PostinRUDOLPH, Valton A (MRN [id number]) as of 06/10/2018 12:41  Ref. Range 02/06/2018 16:05 02/07/2018 07:00 03/11/2018 09:18  C206 ACTH Latest Ref Range: 6 - 50 pg/mL 363.0 (H) 423.0 (H) 839 (H)     ASSESSMENT / PLAN / RECOMMENDATIONS:   Primary Adrenal Insufficiency :  - Unclear cause, most likely autoimmune, iron studies were unremarkable, CT scan was normal. -  Pt has a medical alert necklace - Repeat BMP at PCP's office was normal ( 01/2021) - We again reviewed the sick day rule.  -Patient is questioning if his adrenal gland is working normally at this time, we discussed the process of switching prednisone to hydrocortisone and holding hydrocortisone the day of test before we test for cortisol but I explained to the patient this is a risky procedure especially given a diagnosis of primary adrenal insufficiency and I am not comfortable doing this.  I have asked him to get a second opinion on his diagnosis -No changes to his regimen at this time -I did see prednisone 50 mg on his med list that was entered through a historical provider in 09/2020, I have asked the patient to go home and check his bottle and that his prescription of prednisone should be 5 mg and NOT   Medications   Continue Prednisone 5 mg, 1 tablet with Breakfast   Continue Florinef  0.1 mg, 1 tablet  daily     F/u in 1 yr    Signed electronically by: Lyndle Herrlich, MD  [facility]  [facility] [address] [place], [place] [address] Phone: [phone] FAX: [phone]      CC: Wilfrid Lund, PA [address] [place] [address] Phone: [phone]  Fax: [phone]   Return to Endocrinology clinic as below: Future Appointments  Date Time Provider Department Center  06/30/2021 10:20 AM Chilton Si, MD [facility]  09/22/2021 10:30 AM Tonny Bollman, MD [facility] None  04/05/2022  9:30 AM Monifah Freehling, Konrad Dolores, MD [facility]  None

## 2021-04-06 NOTE — Patient Instructions (Signed)
-  Continue Prednisone 5 mg daily with breakfast  - Continue Florinef 0.1 mg daily     ADRENAL INSUFFICIENCY SICK DAY RULES:  Should you face an extreme emotional or physical stress such as trauma, surgery or acute illness, this will require extra steroid coverage so that the body can meet that stress.   Without increasing the steroid dose you may experience severe weakness, headache, dizziness, nausea and vomiting and possibly a more serious deterioration in health.  Typically the dose of steroids will only need to be increased for a couple of days if you have an illness that is transient and managed in the community.   If you are unable to take/absorb an increased dose of steroids orally because of vomiting or diarrhea, you will urgently require steroid injections and should present to an Emergency Department.  The general advice for any serious illness is as follows: Double the normal daily steroid dose for up to 3 days if you have a temperature of more than 37.50C (99.31F) with signs of sickness, or severe emotional or physical distress Contact your primary care doctor and Endocrinologist if the illness worsens or it lasts for more than 3 days.  In cases of severe illness, urgent medical assistance should be promptly sought. If you experience vomiting/diarrhea or are unable to take steroids by mouth, please administer the Hydrocortisone injection kit and seek urgent medical help.

## 2021-06-28 NOTE — Progress Notes (Incomplete)
? ? ?Cardiology Office Note ? ? ?Date:  06/28/2021  ? ?ID:  Nicholas Coleman, DOB 1960-07-24, MRN [id number] ? ?PCP:  Lois Huxley, PA  ?Cardiologist:   Madelin Rear  ? ?No chief complaint on file. ? ?  ?History of Present Illness: ?Nicholas Coleman is a 61 y.o. male with OSA on CPAP,  Adrenal insufficiency, hypertension and hyperlipidemia who presents for follow up.  He was initially seen 07/2015 for the evaluation of chest discomfort.  His symptoms were atypical.  However given that he had risk factors he was referred for an ETT 07/2015 that was negative for ischemia.  He achieved 13.4 METS on a Bruce protocol.  At that time lipids were checked and it was recommended that he increase his simvastatin.  He never returned for follow-up testing.  He followed up 12/2017 at which time he was dealing with GI distress after changing his diet.  He had an abdominal ultrasound and CT that did not reveal any significant pathology.  He followed up with GI and has been doing better lately.  At that appointment he also complained of lightheadedness and dizziness after bending over and standing up.  He reported exertional dyspnea.  He was referred for coronary CT-A but never had it due to lack of insurance approval.  He did have an echo 12/2017 that revealed LVEF 55 to 60% with grade 1 diastolic dysfunction and mild LVH.  Due to his orthostatic symptoms hydrochlorothiazide was reduced to 12.5 mg.  He is no longer lightheaded or dizzy.  He has been exercising regularly without exertional dyspnea or chest pain.  He has no lower extremity edema, orthopnea, or PND.  He plans to start a new diet soon.  It will start with 10 days of detox from carbohydrates. ? ?[name] was last seen in 2019.  At that time he was doing well.  ? ?At his last appointment he reported 1 episode of chest tightness that occurred at rest several months prior. He noted SOB after climbing 3 flights of stairs, but he expected this. He was working with a  nutritionist and following a gluten-free, dairy-free, and sugar-free diet. ?Lately he has been feeling well. He did have 1 episode of chest tightness that occurred several months ago. It occurred at rest. He has not had any exertional chest pain or shortness of breath. He works 12-hour shifts and does a lot of walking at work. He has no exertional chest pain or shortness of breath with those activities. He does note that if he goes up about 3 flights of steps he gets short of breath, but not more than he would expect. He has been using his treadmill a couple days per week outside of work and feels well. He denies any lower extremity edema, orthopnea, or PND. He sleeps with his CPAP machine regularly. He has been working with a nutritionist through his [employer]. He has been gluten-free, dairy free, and sugar-free. He has been able to lose 18 pounds in the last several months with this intervention. He also started an accountability program online this week. Overall he is feeling well and is without complaint. ? ?Today, *** ? ?Past Medical History:  ?Diagnosis Date  ? AKI (acute kidney injury) ([facility]) 02/05/2018  ? Atypical chest pain 12/11/2017  ? BPH (benign prostatic hyperplasia) 07/12/2015  ? Essential hypertension 07/12/2015  ? Exertional dyspnea 12/11/2017  ? Hyperlipidemia   ? Hypertension   ? Mild persistent asthma 06/19/2019  ? Obesity (  BMI 30.0-34.9) 03/24/2020  ? OSA on CPAP 03/24/2020  ? ? ?No past surgical history on file. ? ? ?Current Outpatient Medications  ?Medication Sig Dispense Refill  ? alfuzosin (UROXATRAL) 10 MG 24 hr tablet Take 10 mg by mouth at bedtime.     ? Ascorbic Acid (VITAMIN C) 100 MG tablet Take 100 mg by mouth daily.    ? buPROPion (WELLBUTRIN XL) 150 MG 24 hr tablet Take 150 mg by mouth daily.    ? calcium-vitamin D (OSCAL WITH D) 500-200 MG-UNIT tablet Take 1 tablet by mouth.    ? cetirizine (ZYRTEC) 10 MG tablet Take 1 tablet (10 mg total) by mouth daily as needed for allergies. 31  tablet 5  ? co-enzyme Q-10 30 MG capsule Take 30 mg by mouth daily.    ? finasteride (PROSCAR) 5 MG tablet Take 1 tablet by mouth daily.    ? fludrocortisone (FLORINEF) 0.1 MG tablet Take 1 tablet (100 mcg total) by mouth daily. 90 tablet 3  ? fluticasone (FLONASE) 50 MCG/ACT nasal spray SHAKE LIQUID AND USE 2 SPRAYS IN EACH NOSTRIL AT NIGHT 16 g 3  ? Glucosamine Sulfate 500 MG TABS Take by mouth.    ? lithium 300 MG tablet Take 300 mg by mouth at bedtime.    ? Magnesium 100 MG TABS Take by mouth.    ? montelukast (SINGULAIR) 10 MG tablet Take 1 tablet at night. 90 tablet 3  ? Multiple Vitamin (MULTIVITAMIN) capsule Take 1 capsule by mouth daily.    ? Omega-3 Fatty Acids (FISH OIL PO) Take 1 capsule by mouth daily.     ? omeprazole (PRILOSEC) 40 MG capsule Take 1 capsule (40 mg total) by mouth daily as needed. 30 capsule 5  ? predniSONE (DELTASONE) 5 MG tablet Take 1 tablet (5 mg total) by mouth daily with breakfast. Pt may double up during sick days 100 tablet 3  ? simvastatin (ZOCOR) 40 MG tablet Take 40 mg by mouth at bedtime.     ? Vitamin E 100 units TABS Take 180 mg by mouth daily.    ? ?No current facility-administered medications for this visit.  ? ? ?Allergies:   Other, [place], Hydrocodone-acetaminophen, and Oxycodone  ? ? ?Social History:  The patient  reports that he has quit smoking. His smoking use included cigarettes. He has never used smokeless tobacco. He reports that he does not drink alcohol and does not use drugs.  ? ?Family History:  The patient's family history includes CAD in his father; Lymphoma in his mother; Stroke in his father.  ? ? ?ROS:   ?Please see the history of present illness. ? ?All other systems are reviewed and negative.  ? ? ?PHYSICAL EXAM: ?VS:  There were no vitals taken for this visit. , BMI There is no height or weight on file to calculate BMI. ?GENERAL:  Well appearing ?HEENT: Pupils equal round and reactive, fundi not visualized, oral mucosa unremarkable ?NECK:  No  jugular venous distention, waveform within normal limits, carotid upstroke brisk and symmetric, no bruits, ?LUNGS:  Clear to auscultation bilaterally ?HEART:  RRR.  PMI not displaced or sustained,S1 and S2 within normal limits, no S3, no S4, no clicks, no rubs, no murmurs ?ABD:  Flat, positive bowel sounds normal in frequency in pitch, no bruits, no rebound, no guarding, no midline pulsatile mass, no hepatomegaly, no splenomegaly ?EXT:  2 plus pulses throughout, no edema, no cyanosis no clubbing ?SKIN:  No rashes no nodules ?NEURO:  Cranial  nerves II through XII grossly intact, motor grossly intact throughout ?PSYCH:  Cognitively intact, oriented to person place and time ? ?EKG:  EKG is personally reviewed. ?06/30/2021: Sinus ***. Rate *** bpm. ?03/24/2020: Sinus rhythm. Rate 67 bpm.  ?12/11/2017: Sinus rhythm.  Rate 83 bpm. ?07/12/2015: sinus rhythm. Rate 69 bpm. ? ? ?Echo 12/12/17: ?Study Conclusions ?  ?- Left ventricle: The cavity size was normal. Wall thickness was ?  increased in a pattern of mild LVH. Systolic function was normal. ?  The estimated ejection fraction was in the range of 55% to 60%. ?  Wall motion was normal; there were no regional wall motion ?  abnormalities. Doppler parameters are consistent with abnormal ?  left ventricular relaxation (grade 1 diastolic dysfunction). ?  ?Impressions: ?  ?- Normal LV function; mild LVH; mild diastolic dysfunction. ? ?ETT 07/27/15: ?Blood pressure demonstrated a hypotensive response to exercise. ?Upsloping ST segment depression ST segment depression was noted during stress in the II, III, aVF, V4, V5 and V6 leads. ?Excellent exercise capacity. ?Negative, adequate stress test. ? ? ?Recent Labs: ?No results found for requested labs within last 8760 hours.  ? ? ?Lipid Panel ?   ?Component Value Date/Time  ? CHOL 225 (H) 07/27/2015 0752  ? TRIG 65 07/27/2015 0752  ? HDL 69 07/27/2015 0752  ? CHOLHDL 3.3 07/27/2015 0752  ? LDLCALC 143 (H) 07/27/2015 0752  ? ?  ? ?Wt  Readings from Last 3 Encounters:  ?04/06/21 238 lb (108 kg)  ?09/23/20 235 lb 9.6 oz (106.9 kg)  ?03/31/20 245 lb 8 oz (111.4 kg)  ?  ? ? ?ASSESSMENT AND PLAN: ? ?No problem-specific Assessment & Plan notes found fo

## 2021-06-29 ENCOUNTER — Encounter (HOSPITAL_BASED_OUTPATIENT_CLINIC_OR_DEPARTMENT_OTHER): Payer: Self-pay

## 2021-06-30 ENCOUNTER — Ambulatory Visit (HOSPITAL_BASED_OUTPATIENT_CLINIC_OR_DEPARTMENT_OTHER): Payer: Managed Care, Other (non HMO) | Admitting: Cardiovascular Disease

## 2021-09-19 ENCOUNTER — Other Ambulatory Visit: Payer: Self-pay | Admitting: Allergy & Immunology

## 2021-09-20 NOTE — Progress Notes (Unsigned)
FOLLOW UP Date of Service/Encounter:  09/22/21   Subjective:  Nicholas Coleman (DOB: October 23, 1960) is a 61 y.o. male with OSA, HTN, dyslipidemia, BPH,  adrenal insufficiency on low-dose prednisone [facility] in re-evaluation of the following: intermittent asthma, allergic rhinitis, GERD History obtained from: chart review and patient.  For Review, LV was on 09/23/20  with Dr. Dellis Anes seen for routine follow-up.Asthma was removed from his diagnosis list. He continued with montelukast and omeprazole PRN.  2020 Diagnostics: Allergy skin test were positive to tree pollens, grass pollens, some molds,.  Mild reactions to cat and dog  Today presents for follow-up. Allergic rhinitis: He is using singulair daily.  He also takes zyrtec (cetirizine) daily.  He takes them both every day and he is unsure how effective either of them are.  He has never trialed.  Off of these medicines.  He uses flonase during flares which is typically during spring and sometimes winter.  The Flonase is very effective for him.  He has not required any antibiotics since his last visit.  No sinus infections.  GERD: He is following a nutritionist and heartburn is no longer an issue.   He does have adrenal insufficiency and takes prednisone.  This has made weight loss difficult for him.   Allergies as of 09/22/2021       Reactions   Other Cough   Cat and dog dander cough and sinus build up   [facility] Other (See Comments)   Cough and sinus build up   Hydrocodone-acetaminophen Anxiety, Other (See Comments)   Insomnia, "jittery"   Oxycodone Anxiety, Other (See Comments)   Insomnia, "jittery"        Medication List        Accurate as of September 22, 2021 10:37 AM. If you have any questions, ask your nurse or doctor.          alfuzosin 10 MG 24 hr tablet Commonly known as: UROXATRAL Take 10 mg by mouth at bedtime.   buPROPion 150 MG 24 hr tablet Commonly known as:  WELLBUTRIN XL Take 150 mg by mouth daily.   calcium-vitamin D 500-200 MG-UNIT tablet Commonly known as: OSCAL WITH D Take 1 tablet by mouth.   cetirizine 10 MG tablet Commonly known as: ZYRTEC Take 1 tablet (10 mg total) by mouth daily as needed for allergies.   co-enzyme Q-10 30 MG capsule Take 30 mg by mouth daily.   Coenzyme Q10 50 MG Caps Take by mouth.   fenoprofen 600 MG Tabs tablet Commonly known as: NALFON Take by mouth.   finasteride 5 MG tablet Commonly known as: PROSCAR Take 1 tablet by mouth daily.   FISH OIL PO Take 1 capsule by mouth daily.   fludrocortisone 0.1 MG tablet Commonly known as: FLORINEF Take 1 tablet (100 mcg total) by mouth daily.   fluticasone 50 MCG/ACT nasal spray Commonly known as: FLONASE SHAKE LIQUID AND USE 2 SPRAYS IN EACH NOSTRIL AT NIGHT   Glucosamine Sulfate 500 MG Tabs Take by mouth.   lithium 300 MG tablet Take 300 mg by mouth at bedtime.   Magnesium 100 MG Tabs Take by mouth.   Magnesium Gluconate 550 MG Tabs Take by mouth.   montelukast 10 MG tablet Commonly known as: Singulair Take 1 tablet at night.   multivitamin capsule Take 1 capsule by mouth daily.   omeprazole 40 MG capsule Commonly known as: PRILOSEC Take 1 capsule (40 mg total) by mouth  daily as needed.   predniSONE 5 MG tablet Commonly known as: DELTASONE Take 1 tablet (5 mg total) by mouth daily with breakfast. Pt may double up during sick days   simvastatin 20 MG tablet Commonly known as: ZOCOR Take by mouth.   simvastatin 40 MG tablet Commonly known as: ZOCOR Take 40 mg by mouth at bedtime.   vitamin C 100 MG tablet Take 100 mg by mouth daily.   Vitamin E 100 units Tabs Take 180 mg by mouth daily.       Past Medical History:  Diagnosis Date   AKI (acute kidney injury) ([facility]CC) 02/05/2018   Atypical chest pain 12/11/2017   BPH (benign prostatic hyperplasia) 07/12/2015   Essential hypertension 07/12/2015   Exertional dyspnea 12/11/2017    Hyperlipidemia    Hypertension    Mild persistent asthma 06/19/2019   Obesity (BMI 30.0-34.9) 03/24/2020   OSA on CPAP 03/24/2020   History reviewed. No pertinent surgical history. Otherwise, there have been no changes to his past medical history, surgical history, family history, or social history.  ROS: All others negative except as noted per HPI.   Objective:  BP 128/66   Pulse (!) 53   Temp 98.1 F (36.7 C) (Temporal)   Resp 18   Wt 243 lb 6.4 oz (110.4 kg)   SpO2 97%   BMI 32.11 kg/m  Body mass index is 32.11 kg/m. Physical Exam: General Appearance:  Alert, cooperative, no distress, appears stated age  Head:  Normocephalic, without obvious abnormality, atraumatic  Eyes:  Conjunctiva clear, EOM's intact  Nose: Nares normal,  septum slightly deviated, normal mucosa, and no visible anterior polyps  Throat: Lips, tongue normal; teeth and gums normal, normal posterior oropharynx  Neck: Supple, symmetrical  Lungs:   clear to auscultation bilaterally, Respirations unlabored, no coughing  Heart:  regular rate and rhythm and no murmur, Appears well perfused  Extremities: No edema  Skin: Skin color, texture, turgor normal, no rashes or lesions on visualized portions of skin  Neurologic: No gross deficits     Assessment/Plan   1. Allergic rhinitis-stable - Continue with the montelukast 10mg  daily.  Can try stopping for few weeks to see if it makes a difference.  If not can discontinue. - Continue with Zyrtec (cetirizine) 10 mg daily, alternatively can try switching to Xyzal (levocetirizine) 5 mg daily to see if this is more efficacious - use flonase (fluticasone) 1-2 sprays daily as needed  2. Gastroesophageal reflux disease-resolved - diet and lifestyle modifications as below  3. Return in about 1 year (around 09/23/2022).   09/25/2022, MD  [facility]

## 2021-09-22 ENCOUNTER — Encounter: Payer: Self-pay | Admitting: Internal Medicine

## 2021-09-22 ENCOUNTER — Ambulatory Visit (INDEPENDENT_AMBULATORY_CARE_PROVIDER_SITE_OTHER): Payer: Managed Care, Other (non HMO) | Admitting: Internal Medicine

## 2021-09-22 VITALS — BP 128/66 | HR 53 | Temp 98.1°F | Resp 18 | Wt 243.4 lb

## 2021-09-22 DIAGNOSIS — K219 Gastro-esophageal reflux disease without esophagitis: Secondary | ICD-10-CM

## 2021-09-22 DIAGNOSIS — J3089 Other allergic rhinitis: Secondary | ICD-10-CM

## 2021-09-22 MED ORDER — FLUTICASONE PROPIONATE 50 MCG/ACT NA SUSP: NASAL | 5 refills | Status: AC

## 2021-09-22 MED ORDER — MONTELUKAST SODIUM 10 MG PO TABS: ORAL_TABLET | ORAL | 3 refills | Status: AC

## 2021-09-22 NOTE — Patient Instructions (Addendum)
1. Allergic rhinitis - Continue with the montelukast 10mg  daily.  Can try stopping for few weeks to see if it makes a difference.  If not can discontinue. - Continue with Zyrtec (cetirizine) 10 mg daily, alternatively can try switching to Xyzal (levocetirizine) 5 mg daily to see if this is more efficacious - use flonase (fluticasone) 1-2 sprays daily as needed  2. Gastroesophageal reflux disease-resolved - diet and lifestyle modifications as below  3. Return in about 1 year (around 09/23/2022).    Please inform 09/25/2022 of any Emergency Department visits, hospitalizations, or changes in symptoms. Call us before going to the ED for breathing or allergy symptoms since we might be able to fit you in for a sick visit. Feel free to contact us anytime with any questions, problems, or concerns.  It was a pleasure to meet you today!  Websites that have reliable patient information: 1. American Academy of Asthma, Allergy, and Immunology: www.aaaai.org 2. Food Allergy Research and Education (FARE): foodallergy.org 3. Mothers of Asthmatics: http://www.asthmacommunitynetwork.org 4. American College of Allergy, Asthma, and Immunology: www.acaai.org  Gastroesophageal Reflux Induced Respiratory Disease and Laryngopharyngeal Reflux (LPR): Gastroesophageal reflux disease (GERD) is a condition where the contents of the stomach reflux or back up into the esophagus or swallowing tube.  This can result in a variety of clinical symptoms including classic symptoms and atypical symptoms.  Classic symptoms of GERD include: heartburn, chest pain, acid taste in the mouth, and difficulty in swallowing.  Atypical symptoms of GERD include laryngopharyngeal reflux (LPR) and asthma.  LPR occurs when stomach reflux comes all the way up to the throat.  Clinical symptoms include hoarseness, raspy voice, laryngitis, throat clearing, postnasal drip, mucus stuck in the throat, a sensation of a lump in the throat, sore throat, and cough.   Most patients with LPR do not have classic symptoms of GERD.  Asthma can also be triggered by GERD.  The acid stomach fluid can stimulate nerve fibers in the esophagus which can cause an increase in bronchial muscle tone and narrowing of the airways.  Acid stomach contents may also reflux into the trachea and bronchi of the lungs where it can trigger an asthma attack.  Many people with GERD triggered asthma do not have classic symptoms of GERD.  Diagnosis of LPR and GERD induced asthma is frequently made from a typical history and response to medications.  It may take several months of medications to see a good response.  Occasionally, a 24-hour esophageal pH probe study must be performed.  Treatment of GERD/LPR includes:   Modification of diet and lifestyle Stop smoking Avoid overeating and lose weight Avoid acidic and fatty foods, chocolate, onions, garlic, peppermint Elevate the head of your bed 6 to 8 inches with blocks or wedge Medications Zantac, Pepcid, Axid, Tagamet Prilosec, Prevacid, Aciphex, Protonix, Nexium Surgery

## 2021-11-02 ENCOUNTER — Ambulatory Visit (HOSPITAL_BASED_OUTPATIENT_CLINIC_OR_DEPARTMENT_OTHER): Payer: Managed Care, Other (non HMO) | Admitting: Cardiovascular Disease

## 2021-11-30 ENCOUNTER — Ambulatory Visit (HOSPITAL_BASED_OUTPATIENT_CLINIC_OR_DEPARTMENT_OTHER): Payer: Managed Care, Other (non HMO) | Admitting: Family

## 2021-11-30 ENCOUNTER — Encounter (HOSPITAL_BASED_OUTPATIENT_CLINIC_OR_DEPARTMENT_OTHER): Payer: Self-pay | Admitting: Family

## 2021-11-30 VITALS — BP 126/72 | HR 63 | Ht 73.0 in | Wt 242.6 lb

## 2021-11-30 DIAGNOSIS — G4733 Obstructive sleep apnea (adult) (pediatric): Secondary | ICD-10-CM | POA: Diagnosis not present

## 2021-11-30 DIAGNOSIS — Z9989 Dependence on other enabling machines and devices: Secondary | ICD-10-CM

## 2021-11-30 DIAGNOSIS — I491 Atrial premature depolarization: Secondary | ICD-10-CM

## 2021-11-30 DIAGNOSIS — E782 Mixed hyperlipidemia: Secondary | ICD-10-CM

## 2021-11-30 NOTE — Patient Instructions (Addendum)
Medication Instructions:  None ordered today.   *If you need a refill on your cardiac medications before your next appointment, please call your pharmacy*  Lab Work: None ordered today.   Testing/Procedures: Your EKG today showed normal sinus rhythm with an occasional early beat called a PAC which is not dangerous nor of concern.   We could consider a CT scan to assess for any plaque buildup in the heart. Coronary calcium score is $99 out of pocket and counts any plaque in the heart A cardiac CTA (CPT [phone]) measures the plaque in the heart, gives more detail about percentage blocked, and measures blood flow. This is a more detailed test that is run through the insurance.   Follow-Up: At [facility], you and your health needs are our priority.  As part of our continuing mission to provide you with exceptional heart care, we have created designated Provider Care Teams.  These Care Teams include your primary Cardiologist (physician) and Advanced Practice Providers (APPs -  Physician Assistants and Nurse Practitioners) who all work together to provide you with the care you need, when you need it.  We recommend signing up for the patient portal called "MyChart".  Sign up information is provided on this After Visit Summary.  MyChart is used to connect with patients for Virtual Visits (Telemedicine).  Patients are able to view lab/test results, encounter notes, upcoming appointments, etc.  Non-urgent messages can be sent to your provider as well.   To learn more about what you can do with MyChart, go to [email or web address].    Your next appointment:   As needed with Dr. Oval Linsey or Loel Dubonnet, NP   Other Instructions  Heart Healthy Diet Recommendations: A low-salt diet is recommended. Meats should be grilled, baked, or boiled. Avoid fried foods. Focus on lean protein sources like fish or chicken with vegetables and fruits. The American Heart Association is a Microbiologist!   American Heart Association Diet and Lifeystyle Recommendations   Exercise recommendations: The American Heart Association recommends 150 minutes of moderate intensity exercise weekly. Try 30 minutes of moderate intensity exercise 4-5 times per week. This could include walking, jogging, or swimming.   Important Information About Sugar

## 2021-11-30 NOTE — Progress Notes (Signed)
Office Visit    Patient Name: Nicholas Coleman Date of Encounter: 12/02/2021  PCP:  Lois Huxley, [place]  Cardiologist:  Skeet Latch, MD  Advanced Practice Provider:  No care team member to display Electrophysiologist:  None      Chief Complaint    Nicholas Coleman is a 61 y.o. male presents today for hypertension follow-up  Past Medical History    Past Medical History:  Diagnosis Date   AKI (acute kidney injury) ([facility]) 02/05/2018   Atypical chest pain 12/11/2017   BPH (benign prostatic hyperplasia) 07/12/2015   Essential hypertension 07/12/2015   Exertional dyspnea 12/11/2017   Hyperlipidemia    Hypertension    Mild persistent asthma 06/19/2019   Obesity (BMI 30.0-34.9) 03/24/2020   OSA on CPAP 03/24/2020   No past surgical history on file.  Allergies  Allergies  Allergen Reactions   Other Cough    Cat and dog dander cough and sinus build up   [facility] Other (See Comments)    Cough and sinus build up   Hydrocodone-Acetaminophen Anxiety and Other (See Comments)    Insomnia, "jittery"   Oxycodone Anxiety and Other (See Comments)    Insomnia, "jittery"    History of Present Illness    Nicholas Coleman is a 61 y.o. male with a hx of OSA on CPAP, hypertension, adrenal insufficiency, hyperlipidemia last seen 03/24/20 by Dr. Oval Linsey.  Seen 07/2015 for chest discomfort.  ETT 07/2015 negative for ischemia able to achieve 13.4 METS on Bruce protocol.  He followed up 12/2017 most notably concerned about stomach upset following with GI.  He also noted exertional dyspnea and was referred for coronary CTA but never completed due to lack of insurance.  Echo 12/2017 EF 55 to [id number], grade 1 diastolic dysfunction, mild LVH.  Due to orthostasis hydrochlorothiazide reduced to 12.5 mg.  Most recently seen 03/2020 doing well from a cardiac perspective and recommended follow-up in 1 year.  Presents today for follow-up independently. Recently got back  from [place] with his [place].  Notes since his adrenal insufficiency was treated his blood pressure has been well controlled without antihypertensive agents. Reports no shortness of breath nor dyspnea on exertion. Reports no chest pain, pressure, or tightness. No edema, orthopnea, PND. Reports no palpitations.    EKGs/Labs/Other Studies Reviewed:   The following studies were reviewed today:   EKG:  EKG is ordered today.  The ekg ordered today demonstrates NSR 63 bpm with occasional PAC. No acute ST/T wave changes.   Recent Labs: No results found for requested labs within last 365 days.  Recent Lipid Panel    Component Value Date/Time   CHOL 225 (H) 07/27/2015 0752   TRIG 65 07/27/2015 0752   HDL 69 07/27/2015 0752   CHOLHDL 3.3 07/27/2015 0752   LDLCALC 143 (H) 07/27/2015 0752    Home Medications   Current Meds  Medication Sig   alfuzosin (UROXATRAL) 10 MG 24 hr tablet Take 10 mg by mouth at bedtime.    Ascorbic Acid (VITAMIN C) 100 MG tablet Take 100 mg by mouth daily.   buPROPion (WELLBUTRIN XL) 150 MG 24 hr tablet Take 150 mg by mouth daily.   calcium-vitamin D (OSCAL WITH D) 500-200 MG-UNIT tablet Take 1 tablet by mouth.   cetirizine (ZYRTEC) 10 MG tablet Take 1 tablet (10 mg total) by mouth daily as needed for allergies.   Cholecalciferol (VITAMIN D-1000 MAX ST)  25 MCG (1000 UT) tablet Take 1 tablet by mouth daily at 12 noon.   co-enzyme Q-10 30 MG capsule Take 30 mg by mouth daily.   fenoprofen (NALFON) 600 MG TABS tablet Take by mouth.   finasteride (PROSCAR) 5 MG tablet Take 1 tablet by mouth daily.   fludrocortisone (FLORINEF) 0.1 MG tablet Take 1 tablet (100 mcg total) by mouth daily.   fluticasone (FLONASE) 50 MCG/ACT nasal spray SHAKE LIQUID AND USE 2 SPRAYS IN EACH NOSTRIL AT NIGHT   Glucosamine Sulfate 500 MG TABS Take by mouth.   lithium 300 MG tablet Take 300 mg by mouth at bedtime.   Magnesium Gluconate 550 MG TABS Take by mouth.   montelukast  (SINGULAIR) 10 MG tablet Take 1 tablet at night.   Multiple Vitamin (MULTIVITAMIN) capsule Take 1 capsule by mouth daily.   Omega-3 Fatty Acids (FISH OIL PO) Take 1 capsule by mouth daily.    omeprazole (PRILOSEC) 40 MG capsule Take 1 capsule (40 mg total) by mouth daily as needed.   predniSONE (DELTASONE) 5 MG tablet Take 1 tablet (5 mg total) by mouth daily with breakfast. Pt may double up during sick days   simvastatin (ZOCOR) 40 MG tablet Take 40 mg by mouth at bedtime.    Vitamin E 100 units TABS Take 180 mg by mouth daily.     Review of Systems      All other systems reviewed and are otherwise negative except as noted above.  Physical Exam    VS:  BP 126/72 (BP Location: Left Arm, Patient Position: Sitting, Cuff Size: Large)   Pulse 63   Ht 6\' 1"  (1.854 m)   Wt 242 lb 9.6 oz (110 kg)   BMI 32.01 kg/m  , BMI Body mass index is 32.01 kg/m.  Wt Readings from Last 3 Encounters:  11/30/21 242 lb 9.6 oz (110 kg)  09/22/21 243 lb 6.4 oz (110.4 kg)  04/06/21 238 lb (108 kg)     GEN: Well nourished, well developed, in no acute distress. HEENT: normal. Neck: Supple, no JVD, carotid bruits, or masses. Cardiac: RRR, no murmurs, rubs, or gallops. No clubbing, cyanosis, edema.  Radials/PT 2+ and equal bilaterally.  Respiratory:  Respirations regular and unlabored, clear to auscultation bilaterally. GI: Soft, nontender, nondistended. MS: No deformity or atrophy. Skin: Warm and dry, no rash. Neuro:  Strength and sensation are intact. Psych: Normal affect.  Assessment & Plan    HTN -no longer requiring antihypertensive agent since treatment of adrenal insufficiency.  BP at goal less than 130/80.  Chest pain -prior evaluation 2017 with negative ETT.  He is without anginal symptoms at this time.  He wishes to follow only as needed with cardiology which is reasonable.  We did offer coronary calcium score versus cardiac CTA if he is further interested in assessing his cardiovascular  risk.  He will contact us if he is interested in this testing.   HLD -continue simvastatin per PCP.  OSA - CPAP compliance encouraged.      Disposition: Follow up prn with Skeet Latch, MD or APP.  Signed, Loel Dubonnet, NP 12/02/2021, 7:24 AM [place]

## 2021-12-02 ENCOUNTER — Encounter (HOSPITAL_BASED_OUTPATIENT_CLINIC_OR_DEPARTMENT_OTHER): Payer: Self-pay | Admitting: Family

## 2022-02-15 ENCOUNTER — Telehealth: Payer: Self-pay | Admitting: Internal Medicine

## 2022-02-15 DIAGNOSIS — E271 Primary adrenocortical insufficiency: Secondary | ICD-10-CM

## 2022-02-15 NOTE — Telephone Encounter (Signed)
Patient called and has requested a referral to be sent to the practice below:  [facility] [address] [place], [place] [address]  Ph: [phone] Fax: [phone]

## 2022-04-04 NOTE — Progress Notes (Unsigned)
Name: Nicholas Coleman  MRN/ DOB: [id number], January 20, 1961    Age/ Sex: 62 y.o., male     PCP: Wilfrid Lund, PA   Reason for Endocrinology Evaluation: Adrenal insufficiency     Initial Endocrinology Clinic Visit: 03/11/2018    PATIENT IDENTIFIER: Nicholas Coleman is a 62 y.o., male with a past medical history of HTN, Dyslipidemia and OSA. He has followed with [facility] since 03/12/2019 for consultative assistance with management of his adrenal insufficiency.   HISTORICAL SUMMARY: The patient was first diagnosed with primary adrenal insufficiency in 01/2018 after presenting to the ED with hypotension, nausea and lightheadedness. IN the ED, he was noted with hyponatremia with a serum sodium Nadir of 117 mEq/L and high normal K= of 5.0 mEq/L .  A cosyntropin Stimulation test was abnormal 60- minute cortisol level of 3.9 ug/dL, with a high ACTH  Level 363.0 pg/mL   He was discharged on HC 15 mg QAM and 10 mg QPM and florinef.  No FH of thyroid  Or autoimmune disease, that he is aware of.    SUBJECTIVE:   During last visit (03/18/2019): Continued prednisone 5 mg with breakfast and continued  florinef to 0.1 mg daily        Today (12/09/2018):  Nicholas Coleman is here for a  follow up on primary adrenal insufficiency. He is compliant with prednisone dose . He is on a diet and has lost weight.   Denies dizziness Energy level stable  Denies constipation or diarrhea  Denies leg swelling  Denies cramps   He has a necklace alert    Endocrine Medications  Prednisone 5 mg daily  Fludrocortisone 0.1 mg daily      HISTORY:  Past Medical History:  Past Medical History:  Diagnosis Date   AKI (acute kidney injury) ([facility]) 02/05/2018   Atypical chest pain 12/11/2017   BPH (benign prostatic hyperplasia) 07/12/2015   Essential hypertension 07/12/2015   Exertional dyspnea 12/11/2017   Hyperlipidemia    Hypertension    Mild persistent asthma 06/19/2019   Obesity (BMI  30.0-34.9) 03/24/2020   OSA on CPAP 03/24/2020   Past Surgical History: No past surgical history on file. Social History:  reports that he has quit smoking. His smoking use included cigarettes. He has never used smokeless tobacco. He reports that he does not drink alcohol and does not use drugs. Family History:  Family History  Problem Relation Age of Onset   Lymphoma Mother    Stroke Father    CAD Father        cabg   Angioedema Neg Hx    Allergic rhinitis Neg Hx    Asthma Neg Hx    Eczema Neg Hx    Immunodeficiency Neg Hx    Urticaria Neg Hx      HOME MEDICATIONS: Allergies as of 04/05/2022       Reactions   Other Cough   Cat and dog dander cough and sinus build up   [facility] Other (See Comments)   Cough and sinus build up   Hydrocodone-acetaminophen Anxiety, Other (See Comments)   Insomnia, "jittery"   Oxycodone Anxiety, Other (See Comments)   Insomnia, "jittery"        Medication List        Accurate as of April 04, 2022 10:34 AM. If you have any questions, ask your nurse or doctor.          alfuzosin 10 MG 24 hr tablet Commonly known as:  UROXATRAL Take 10 mg by mouth at bedtime.   buPROPion 150 MG 24 hr tablet Commonly known as: WELLBUTRIN XL Take 150 mg by mouth daily.   calcium-vitamin D 500-200 MG-UNIT tablet Commonly known as: OSCAL WITH D Take 1 tablet by mouth.   cetirizine 10 MG tablet Commonly known as: ZYRTEC Take 1 tablet (10 mg total) by mouth daily as needed for allergies.   co-enzyme Q-10 30 MG capsule Take 30 mg by mouth daily.   fenoprofen 600 MG Tabs tablet Commonly known as: NALFON Take by mouth.   finasteride 5 MG tablet Commonly known as: PROSCAR Take 1 tablet by mouth daily.   FISH OIL PO Take 1 capsule by mouth daily.   fludrocortisone 0.1 MG tablet Commonly known as: FLORINEF Take 1 tablet (100 mcg total) by mouth daily.   fluticasone 50 MCG/ACT nasal spray Commonly known as: FLONASE SHAKE LIQUID AND USE 2  SPRAYS IN EACH NOSTRIL AT NIGHT   Glucosamine Sulfate 500 MG Tabs Take by mouth.   lithium 300 MG tablet Take 300 mg by mouth at bedtime.   Magnesium Gluconate 550 MG Tabs Take by mouth.   montelukast 10 MG tablet Commonly known as: Singulair Take 1 tablet at night.   multivitamin capsule Take 1 capsule by mouth daily.   omeprazole 40 MG capsule Commonly known as: PRILOSEC Take 1 capsule (40 mg total) by mouth daily as needed.   predniSONE 5 MG tablet Commonly known as: DELTASONE Take 1 tablet (5 mg total) by mouth daily with breakfast. Pt may double up during sick days   simvastatin 40 MG tablet Commonly known as: ZOCOR Take 40 mg by mouth at bedtime.   vitamin C 100 MG tablet Take 100 mg by mouth daily.   Vitamin D-1000 Max St 25 MCG (1000 UT) tablet Generic drug: Cholecalciferol Take 1 tablet by mouth daily at 12 noon.   Vitamin E 100 units Tabs Take 180 mg by mouth daily.          OBJECTIVE:   PHYSICAL EXAM: VS: There were no vitals taken for this visit.   There is no height or weight on file to calculate BSA.   EXAM: General: Pt appears well and is in NAD  Neck: General: Supple without adenopathy. Thyroid: Thyroid size normal.  No goiter or nodules appreciated.   Lungs: Clear with good BS bilat with no rales, rhonchi, or wheezes  Heart: Auscultation: RRR.  Abdomen: Normoactive bowel sounds, soft, nontender, without masses or organomegaly palpable  Extremities:  BL LE: No pretibial edema normal ROM and strength.  Mental Status: Judgment, insight: Intact Mood and affect: No depression, anxiety, or agitation     DATA REVIEWED: 01/12/2021 BUN/Cr 22/1.3 GFR 62 K 4.2 Ca 8.78 Tg 144 LDL 95 HDL 45    Results for Nicholas Coleman, Nicholas Coleman (MRN [id number]) as of 06/10/2018 12:41  Ref. Range 02/06/2018 06:28  Cortisol, Base Latest Units: ug/dL 3.8  Cortisol, 30 Min Latest Units: ug/dL 4.2  Cortisol, 60 Min Latest Units: ug/dL 3.9   Results for  Nicholas Coleman, Nicholas Coleman (MRN [id number]) as of 06/10/2018 12:41  Ref. Range 02/06/2018 16:05 02/07/2018 07:00 03/11/2018 09:18  C206 ACTH Latest Ref Range: 6 - 50 pg/mL 363.0 (H) 423.0 (H) 839 (H)     ASSESSMENT / PLAN / RECOMMENDATIONS:   Primary Adrenal Insufficiency :  - Unclear cause, most likely autoimmune, iron studies were unremarkable, CT scan was normal. - Pt has a medical alert necklace - Repeat BMP at PCP's  office was normal ( 01/2021) - We again reviewed the sick day rule.  -Patient is questioning if his adrenal gland is working normally at this time, we discussed the process of switching prednisone to hydrocortisone and holding hydrocortisone the day of test before we test for cortisol but I explained to the patient this is a risky procedure especially given a diagnosis of primary adrenal insufficiency and I am not comfortable doing this.  I have asked him to get a second opinion on his diagnosis -No changes to his regimen at this time -I did see prednisone 50 mg on his med list that was entered through a historical provider in 09/2020, I have asked the patient to go home and check his bottle and that his prescription of prednisone should be 5 mg and NOT   Medications   Continue Prednisone 5 mg, 1 tablet with Breakfast   Continue Florinef  0.1 mg, 1 tablet  daily     F/u in 1 yr    Signed electronically by: Mack Guise, MD  [facility]  [facility] [address] [place], [place] [address] Phone: [phone] FAX: [phone]      CC: Lois Huxley, [place] [address] Phone: [phone]  Fax: [phone]   Return to Endocrinology clinic as below: Future Appointments  Date Time Provider [place]  04/05/2022  9:30 AM Nazar Kuan, [name] Crazier, MD [facility] None

## 2022-04-05 ENCOUNTER — Ambulatory Visit (INDEPENDENT_AMBULATORY_CARE_PROVIDER_SITE_OTHER): Payer: Managed Care, Other (non HMO) | Admitting: Internal Medicine

## 2022-04-05 ENCOUNTER — Encounter: Payer: Self-pay | Admitting: Internal Medicine

## 2022-04-05 VITALS — BP 126/80 | HR 66 | Ht 73.0 in | Wt 252.0 lb

## 2022-04-05 DIAGNOSIS — E271 Primary adrenocortical insufficiency: Secondary | ICD-10-CM | POA: Diagnosis not present

## 2022-04-05 MED ORDER — FLUDROCORTISONE ACETATE 0.1 MG PO TABS: 100.0000 ug | ORAL_TABLET | Freq: Every day | ORAL | 3 refills | Status: AC

## 2022-04-05 MED ORDER — PREDNISONE 5 MG PO TABS: 5.0000 mg | ORAL_TABLET | Freq: Every day | ORAL | 3 refills | Status: AC

## 2022-04-05 NOTE — Patient Instructions (Signed)
-  Continue Prednisone 5 mg daily with breakfast  °- Continue Florinef 0.1 mg daily  °  ° °ADRENAL INSUFFICIENCY SICK DAY RULES: ° °Should you face an extreme emotional or physical stress such as trauma, surgery or acute illness, this will require extra steroid coverage so that the body can meet that stress.  ° °Without increasing the steroid dose you may experience severe weakness, headache, dizziness, nausea and vomiting and possibly a more serious deterioration in health.  °Typically the dose of steroids will only need to be increased for a couple of days if you have an illness that is transient and managed in the community.  ° °If you are unable to take/absorb an increased dose of steroids orally because of vomiting or diarrhea, you will urgently require steroid injections and should present to an Emergency Department. ° °The general advice for any serious illness is as follows: °Double the normal daily steroid dose for up to 3 days if you have a temperature of more than 37.50C (99.50F) with signs of sickness, or severe emotional or physical distress °Contact your primary care doctor and Endocrinologist if the illness worsens or it lasts for more than 3 days.  °In cases of severe illness, urgent medical assistance should be promptly sought. °If you experience vomiting/diarrhea or are unable to take steroids by mouth, please administer the Hydrocortisone injection kit and seek urgent medical help. ° ° ° °

## 2022-04-14 ENCOUNTER — Telehealth: Payer: Self-pay | Admitting: Internal Medicine

## 2022-04-14 NOTE — Telephone Encounter (Signed)
Transition of Care from [facility] Dr. Kelton Pillar effective 04/05/22. Letter mailed 04/14/22 1st class mail. [facility]

## 2022-07-26 ENCOUNTER — Other Ambulatory Visit (HOSPITAL_BASED_OUTPATIENT_CLINIC_OR_DEPARTMENT_OTHER): Payer: Self-pay | Admitting: Family Medicine

## 2022-07-26 DIAGNOSIS — Z9189 Other specified personal risk factors, not elsewhere classified: Secondary | ICD-10-CM

## 2022-07-26 DIAGNOSIS — E785 Hyperlipidemia, unspecified: Secondary | ICD-10-CM

## 2022-08-16 ENCOUNTER — Ambulatory Visit (HOSPITAL_BASED_OUTPATIENT_CLINIC_OR_DEPARTMENT_OTHER)
Admission: RE | Admit: 2022-08-16 | Discharge: 2022-08-16 | Disposition: A | Discharge status: Home / Self Care | Payer: Managed Care, Other (non HMO) | Source: Ambulatory Visit | Attending: Family Medicine | Admitting: Family Medicine

## 2022-08-16 DIAGNOSIS — Z9189 Other specified personal risk factors, not elsewhere classified: Secondary | ICD-10-CM | POA: Insufficient documentation

## 2022-08-16 DIAGNOSIS — E785 Hyperlipidemia, unspecified: Secondary | ICD-10-CM | POA: Insufficient documentation

## 2022-10-27 DIAGNOSIS — M5442 Lumbago with sciatica, left side: Secondary | ICD-10-CM | POA: Diagnosis not present

## 2022-11-10 DIAGNOSIS — M2569 Stiffness of other specified joint, not elsewhere classified: Secondary | ICD-10-CM | POA: Diagnosis not present

## 2022-11-10 DIAGNOSIS — Z7409 Other reduced mobility: Secondary | ICD-10-CM | POA: Diagnosis not present

## 2022-11-10 DIAGNOSIS — M5442 Lumbago with sciatica, left side: Secondary | ICD-10-CM | POA: Diagnosis not present

## 2022-11-10 DIAGNOSIS — Z713 Dietary counseling and surveillance: Secondary | ICD-10-CM | POA: Diagnosis not present

## 2022-11-17 DIAGNOSIS — M5442 Lumbago with sciatica, left side: Secondary | ICD-10-CM | POA: Diagnosis not present

## 2022-11-17 DIAGNOSIS — Z7409 Other reduced mobility: Secondary | ICD-10-CM | POA: Diagnosis not present

## 2022-11-17 DIAGNOSIS — M2569 Stiffness of other specified joint, not elsewhere classified: Secondary | ICD-10-CM | POA: Diagnosis not present

## 2022-11-30 DIAGNOSIS — N35011 Post-traumatic bulbous urethral stricture: Secondary | ICD-10-CM | POA: Diagnosis not present

## 2022-11-30 DIAGNOSIS — N281 Cyst of kidney, acquired: Secondary | ICD-10-CM | POA: Diagnosis not present

## 2022-11-30 DIAGNOSIS — N200 Calculus of kidney: Secondary | ICD-10-CM | POA: Diagnosis not present

## 2022-11-30 DIAGNOSIS — N401 Enlarged prostate with lower urinary tract symptoms: Secondary | ICD-10-CM | POA: Diagnosis not present

## 2022-12-01 DIAGNOSIS — Z7409 Other reduced mobility: Secondary | ICD-10-CM | POA: Diagnosis not present

## 2022-12-01 DIAGNOSIS — M5442 Lumbago with sciatica, left side: Secondary | ICD-10-CM | POA: Diagnosis not present

## 2022-12-01 DIAGNOSIS — M2569 Stiffness of other specified joint, not elsewhere classified: Secondary | ICD-10-CM | POA: Diagnosis not present

## 2022-12-08 DIAGNOSIS — Z7409 Other reduced mobility: Secondary | ICD-10-CM | POA: Diagnosis not present

## 2022-12-08 DIAGNOSIS — M5442 Lumbago with sciatica, left side: Secondary | ICD-10-CM | POA: Diagnosis not present

## 2022-12-08 DIAGNOSIS — M2569 Stiffness of other specified joint, not elsewhere classified: Secondary | ICD-10-CM | POA: Diagnosis not present

## 2022-12-15 DIAGNOSIS — N401 Enlarged prostate with lower urinary tract symptoms: Secondary | ICD-10-CM | POA: Diagnosis not present

## 2022-12-15 DIAGNOSIS — N138 Other obstructive and reflux uropathy: Secondary | ICD-10-CM | POA: Diagnosis not present

## 2022-12-15 DIAGNOSIS — M5442 Lumbago with sciatica, left side: Secondary | ICD-10-CM | POA: Diagnosis not present

## 2022-12-15 DIAGNOSIS — Z7409 Other reduced mobility: Secondary | ICD-10-CM | POA: Diagnosis not present

## 2022-12-15 DIAGNOSIS — M2569 Stiffness of other specified joint, not elsewhere classified: Secondary | ICD-10-CM | POA: Diagnosis not present

## 2022-12-22 DIAGNOSIS — M5442 Lumbago with sciatica, left side: Secondary | ICD-10-CM | POA: Diagnosis not present

## 2022-12-22 DIAGNOSIS — Z7409 Other reduced mobility: Secondary | ICD-10-CM | POA: Diagnosis not present

## 2022-12-22 DIAGNOSIS — Z713 Dietary counseling and surveillance: Secondary | ICD-10-CM | POA: Diagnosis not present

## 2022-12-22 DIAGNOSIS — M2569 Stiffness of other specified joint, not elsewhere classified: Secondary | ICD-10-CM | POA: Diagnosis not present

## 2022-12-28 DIAGNOSIS — M9903 Segmental and somatic dysfunction of lumbar region: Secondary | ICD-10-CM | POA: Diagnosis not present

## 2022-12-28 DIAGNOSIS — S134XXA Sprain of ligaments of cervical spine, initial encounter: Secondary | ICD-10-CM | POA: Diagnosis not present

## 2022-12-28 DIAGNOSIS — M9902 Segmental and somatic dysfunction of thoracic region: Secondary | ICD-10-CM | POA: Diagnosis not present

## 2022-12-28 DIAGNOSIS — M9901 Segmental and somatic dysfunction of cervical region: Secondary | ICD-10-CM | POA: Diagnosis not present

## 2022-12-29 DIAGNOSIS — L814 Other melanin hyperpigmentation: Secondary | ICD-10-CM | POA: Diagnosis not present

## 2022-12-29 DIAGNOSIS — D2261 Melanocytic nevi of right upper limb, including shoulder: Secondary | ICD-10-CM | POA: Diagnosis not present

## 2022-12-29 DIAGNOSIS — M2569 Stiffness of other specified joint, not elsewhere classified: Secondary | ICD-10-CM | POA: Diagnosis not present

## 2022-12-29 DIAGNOSIS — Z85828 Personal history of other malignant neoplasm of skin: Secondary | ICD-10-CM | POA: Diagnosis not present

## 2022-12-29 DIAGNOSIS — Z7409 Other reduced mobility: Secondary | ICD-10-CM | POA: Diagnosis not present

## 2022-12-29 DIAGNOSIS — L570 Actinic keratosis: Secondary | ICD-10-CM | POA: Diagnosis not present

## 2022-12-29 DIAGNOSIS — D225 Melanocytic nevi of trunk: Secondary | ICD-10-CM | POA: Diagnosis not present

## 2022-12-29 DIAGNOSIS — D2272 Melanocytic nevi of left lower limb, including hip: Secondary | ICD-10-CM | POA: Diagnosis not present

## 2022-12-29 DIAGNOSIS — D485 Neoplasm of uncertain behavior of skin: Secondary | ICD-10-CM | POA: Diagnosis not present

## 2022-12-29 DIAGNOSIS — M5442 Lumbago with sciatica, left side: Secondary | ICD-10-CM | POA: Diagnosis not present

## 2022-12-29 DIAGNOSIS — D2362 Other benign neoplasm of skin of left upper limb, including shoulder: Secondary | ICD-10-CM | POA: Diagnosis not present

## 2023-01-12 DIAGNOSIS — Z7409 Other reduced mobility: Secondary | ICD-10-CM | POA: Diagnosis not present

## 2023-01-12 DIAGNOSIS — M2569 Stiffness of other specified joint, not elsewhere classified: Secondary | ICD-10-CM | POA: Diagnosis not present

## 2023-01-12 DIAGNOSIS — M5442 Lumbago with sciatica, left side: Secondary | ICD-10-CM | POA: Diagnosis not present

## 2023-01-15 DIAGNOSIS — M9903 Segmental and somatic dysfunction of lumbar region: Secondary | ICD-10-CM | POA: Diagnosis not present

## 2023-01-15 DIAGNOSIS — M9902 Segmental and somatic dysfunction of thoracic region: Secondary | ICD-10-CM | POA: Diagnosis not present

## 2023-01-15 DIAGNOSIS — S134XXA Sprain of ligaments of cervical spine, initial encounter: Secondary | ICD-10-CM | POA: Diagnosis not present

## 2023-01-15 DIAGNOSIS — M9901 Segmental and somatic dysfunction of cervical region: Secondary | ICD-10-CM | POA: Diagnosis not present

## 2023-02-02 DIAGNOSIS — M9902 Segmental and somatic dysfunction of thoracic region: Secondary | ICD-10-CM | POA: Diagnosis not present

## 2023-02-02 DIAGNOSIS — M9901 Segmental and somatic dysfunction of cervical region: Secondary | ICD-10-CM | POA: Diagnosis not present

## 2023-02-02 DIAGNOSIS — S134XXA Sprain of ligaments of cervical spine, initial encounter: Secondary | ICD-10-CM | POA: Diagnosis not present

## 2023-02-02 DIAGNOSIS — Z713 Dietary counseling and surveillance: Secondary | ICD-10-CM | POA: Diagnosis not present

## 2023-02-02 DIAGNOSIS — M9903 Segmental and somatic dysfunction of lumbar region: Secondary | ICD-10-CM | POA: Diagnosis not present

## 2023-02-16 DIAGNOSIS — M9903 Segmental and somatic dysfunction of lumbar region: Secondary | ICD-10-CM | POA: Diagnosis not present

## 2023-02-16 DIAGNOSIS — M9902 Segmental and somatic dysfunction of thoracic region: Secondary | ICD-10-CM | POA: Diagnosis not present

## 2023-02-16 DIAGNOSIS — S134XXA Sprain of ligaments of cervical spine, initial encounter: Secondary | ICD-10-CM | POA: Diagnosis not present

## 2023-02-16 DIAGNOSIS — M9901 Segmental and somatic dysfunction of cervical region: Secondary | ICD-10-CM | POA: Diagnosis not present

## 2023-03-02 DIAGNOSIS — Z713 Dietary counseling and surveillance: Secondary | ICD-10-CM | POA: Diagnosis not present

## 2023-03-02 DIAGNOSIS — M9902 Segmental and somatic dysfunction of thoracic region: Secondary | ICD-10-CM | POA: Diagnosis not present

## 2023-03-02 DIAGNOSIS — S134XXA Sprain of ligaments of cervical spine, initial encounter: Secondary | ICD-10-CM | POA: Diagnosis not present

## 2023-03-02 DIAGNOSIS — M9901 Segmental and somatic dysfunction of cervical region: Secondary | ICD-10-CM | POA: Diagnosis not present

## 2023-03-02 DIAGNOSIS — M9903 Segmental and somatic dysfunction of lumbar region: Secondary | ICD-10-CM | POA: Diagnosis not present

## 2023-03-05 DIAGNOSIS — R051 Acute cough: Secondary | ICD-10-CM | POA: Diagnosis not present

## 2023-03-05 DIAGNOSIS — J189 Pneumonia, unspecified organism: Secondary | ICD-10-CM | POA: Diagnosis not present

## 2023-03-16 ENCOUNTER — Encounter: Payer: Self-pay | Admitting: Neurology

## 2023-03-16 DIAGNOSIS — Z125 Encounter for screening for malignant neoplasm of prostate: Secondary | ICD-10-CM | POA: Diagnosis not present

## 2023-03-16 DIAGNOSIS — M722 Plantar fascial fibromatosis: Secondary | ICD-10-CM | POA: Diagnosis not present

## 2023-03-16 DIAGNOSIS — N400 Enlarged prostate without lower urinary tract symptoms: Secondary | ICD-10-CM | POA: Diagnosis not present

## 2023-03-16 DIAGNOSIS — E7800 Pure hypercholesterolemia, unspecified: Secondary | ICD-10-CM | POA: Diagnosis not present

## 2023-03-16 DIAGNOSIS — I10 Essential (primary) hypertension: Secondary | ICD-10-CM | POA: Diagnosis not present

## 2023-03-16 DIAGNOSIS — R251 Tremor, unspecified: Secondary | ICD-10-CM | POA: Diagnosis not present

## 2023-03-16 DIAGNOSIS — E2740 Unspecified adrenocortical insufficiency: Secondary | ICD-10-CM | POA: Diagnosis not present

## 2023-03-16 DIAGNOSIS — F39 Unspecified mood [affective] disorder: Secondary | ICD-10-CM | POA: Diagnosis not present

## 2023-03-16 DIAGNOSIS — Z0000 Encounter for general adult medical examination without abnormal findings: Secondary | ICD-10-CM | POA: Diagnosis not present

## 2023-04-13 DIAGNOSIS — Z713 Dietary counseling and surveillance: Secondary | ICD-10-CM | POA: Diagnosis not present

## 2023-04-27 ENCOUNTER — Ambulatory Visit: Payer: Managed Care, Other (non HMO) | Admitting: Neurology

## 2023-09-27 ENCOUNTER — Encounter: Payer: Self-pay | Admitting: Neurology

## 2023-10-19 NOTE — Progress Notes (Signed)
 Assessment/Plan:   1.  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  Studies are varied, but incidate that up to 2/3 of patients on lithium  will have lithium -induced tremor, even if the patients are not toxic on the medication.  This is just a common side effect of patients on lithium .  I did not recommend that he stop or taper the lithium , but rather talk to his prescribing physician about this.  I did tell him that in my experience, lithium -induced tremor does not respond well to attempts at treating it with other medications.  I am going to go ahead and check his lithium  level, but I feel confident that this is going to be pretty normal.  I think that tremor likely increased in January because of increased stress of losing his job.  He does not disagree.  If everything looks good from my standpoint, he is going to be following back up at the [facility] who prescribes his lithium .  He is in the process of being referred to a psychiatrist from the [place].  He will follow-up here as needed.   Subjective:   Nicholas Coleman was seen today in the movement disorders clinic for neurologic consultation at the request of Turmel, Caleb P, PA.  The consultation is for the evaluation of tremor.  We actually got a referral on this patient back in January for rest tremor for 3 to 6 months, but unfortunately an appointment was never scheduled because the patient lost his insurance.  A new referral was again sent in July.  Tremor: Yes.     How long has it been going on? Jan 2025  At rest or with activation?  Rest but also notes it holding the phone  Fam hx of tremor?  Unsure - maybe dad  Located where?  bilateral UE, L>R.  He is R handed  Affected by caffeine:  doesn't drink much  Affected by alcohol: doesn't drink enough to know  Affected by stress:  unknown - I have been under a lot of stress lately so it may  Affected by fatigue:  possibly  Spills soup if on spoon:  No. But mostly in the L hand and he is R  handed  Affects ADL's (tying shoes, brushing teeth, etc):  No.  Tremor inducing meds:  Yes.   patient is on lithium   Other Specific Symptoms:  Voice: no change Sleep:   Vivid Dreams:  No. I rarely remember my dreams  Acting out dreams:  No. Postural symptoms:  No.  Falls?  No. Bradykinesia symptoms: difficulty getting out of a chair due to knee issues (pushes off to arise); shuffles a little as my slippers are loose Loss of smell:  No. Loss of taste:  No. Urinary Incontinence:  No., has urinary hesitancy Difficulty Swallowing:  No. Handwriting, micrographia: No., always had small handwriting Depression:  No. But it is anxious as he lost his job in Jan N/V:  No. Lightheaded:  No.  Syncope: No. Diplopia:  No. Dyskinesia:  No.  Neuroimaging of the brain has not previously been performed.    PREVIOUS MEDICATIONS: none to date  ALLERGIES:   Allergies  Allergen Reactions   Other Cough    Cat and dog dander cough and sinus build up   [facility] Other (See Comments)    Cough and sinus build up   Hydrocodone-Acetaminophen  Anxiety and Other (See Comments)    Insomnia, jittery   Oxycodone Anxiety and Other (See Comments)  Insomnia, jittery    CURRENT MEDICATIONS:  Current Outpatient Medications  Medication Instructions   alfuzosin  (UROXATRAL ) 10 mg, Daily at bedtime   buPROPion (WELLBUTRIN XL) 150 mg, Daily   calcium-vitamin D  (OSCAL WITH D) 500-200 MG-UNIT tablet 1 tablet   cetirizine  (ZYRTEC ) 10 mg, Oral, Daily PRN   Cholecalciferol (VITAMIN D -1000 MAX ST) 25 MCG (1000 UT) tablet 1 tablet, Daily   co-enzyme Q-10 30 mg, Daily   fenoprofen (NALFON) 600 MG TABS tablet Take by mouth.   finasteride (PROSCAR) 5 MG tablet 1 tablet, Daily   fludrocortisone  (FLORINEF ) 100 mcg, Oral, Daily   fluticasone  (FLONASE ) 50 MCG/ACT nasal spray SHAKE LIQUID AND USE 2 SPRAYS IN EACH NOSTRIL AT NIGHT   Glucosamine Sulfate 500 MG TABS Take by mouth.   lithium  600 mg, Daily at bedtime    Magnesium  100 MG CAPS 1 capsule with a meal Orally Once a day   Magnesium  Gluconate 550 MG TABS Take by mouth.   montelukast  (SINGULAIR ) 10 MG tablet Take 1 tablet at night.   Multiple Vitamin (MULTIVITAMIN) capsule 1 capsule, Daily   Omega-3 Fatty Acids (FISH OIL PO) 1 capsule, Daily   omeprazole  (PRILOSEC) 40 mg, Oral, Daily PRN   oxyCODONE (OXY IR/ROXICODONE) 5 mg, Every 8 hours PRN   predniSONE  (DELTASONE ) 5 mg, Oral, Daily with breakfast, Pt may double up during sick days   simvastatin  (ZOCOR ) 40 mg, Daily at bedtime   vitamin C 100 mg, Daily   Vitamin E 180 mg, Daily    Objective:   PHYSICAL EXAMINATION:    VITALS:   Vitals:   10/23/23 0921  BP: 111/72  Pulse: 82  SpO2: 97%  Weight: 237 lb (107.5 kg)  Height: 6' (1.829 m)    GEN:  The patient appears stated age and is in NAD. HEENT:  Normocephalic, atraumatic.  The mucous membranes are moist. The superficial temporal arteries are without ropiness or tenderness. CV:  RRR Lungs:  CTAB Neck/HEME:  There are no carotid bruits bilaterally.  Neurological examination:  Orientation: The patient is alert and oriented x3.  Cranial nerves: There is good facial symmetry.  Extraocular muscles are intact. The visual fields are full to confrontational testing. The speech is fluent and clear. Soft palate rises symmetrically and there is no tongue deviation. Hearing is intact to conversational tone. Sensation: Sensation is intact to light touch throughout (facial, trunk, extremities). Vibration is intact at the bilateral big toe. There is no extinction with double simultaneous stimulation.  Motor: Strength is 5/5 in the bilateral upper and lower extremities.   Shoulder shrug is equal and symmetric.  There is no pronator drift. Deep tendon reflexes: Deep tendon reflexes are 2/4 at the bilateral biceps, triceps, brachioradialis, patella and achilles. Plantar responses are downgoing bilaterally.  Movement examination: Tone: There is  normal tone in the bilateral upper extremities.  The tone in the lower extremities is normal.  Abnormal movements: There is no rest tremor, even with distraction procedures.  There is no postural tremor.  There is intention tremor, very mild on the left.  The most significant tremor was noted when he held an object close to his mouth as if he were going to take a drink with his arms in the wing beating position.  He then had a moderate degree of tremor in the right.  None on the left.  He had no trouble with Archimedes spirals.  He had no trouble pouring water from 1 glass to another.     Coordination:  There is no decremation with RAM's, with any form of RAMS, including alternating supination and pronation of the forearm, hand opening and closing, finger taps, heel taps and toe taps.   Gait and Station: The patient has no difficulty arising out of a deep-seated chair without the use of the hands. The patient's stride length is good.   I have reviewed and interpreted the following labs independently   Chemistry      Component Value Date/Time   NA 135 03/18/2019 1005   NA 140 07/27/2015 0752   K 4.1 03/18/2019 1005   CL 99 03/18/2019 1005   CO2 29 03/18/2019 1005   BUN 28 (H) 03/18/2019 1005   BUN 23 07/27/2015 0752   CREATININE 1.19 03/18/2019 1005      Component Value Date/Time   CALCIUM 9.7 03/18/2019 1005   ALKPHOS 43 12/09/2018 0915   AST 17 12/09/2018 0915   ALT 29 12/09/2018 0915   BILITOT 0.4 12/09/2018 0915   BILITOT 0.6 07/27/2015 0752      Lab Results  Component Value Date   TSH 1.01 03/18/2019   Lab Results  Component Value Date   WBC 8.9 12/09/2018   HGB 14.3 12/09/2018   HCT 41.2 12/09/2018   MCV 89.1 12/09/2018   PLT 175.0 12/09/2018      Total time spent on today's visit was 45 minutes, including both face-to-face time and nonface-to-face time.  Time included that spent on review of records (prior notes available to me/labs/imaging if pertinent),  discussing treatment and goals, answering patient's questions and coordinating care.  Cc:  Alben Therisa MATSU, PA

## 2023-10-23 ENCOUNTER — Ambulatory Visit: Payer: Self-pay | Admitting: Neurology

## 2023-10-23 ENCOUNTER — Encounter: Payer: Self-pay | Admitting: Neurology

## 2023-10-23 ENCOUNTER — Other Ambulatory Visit

## 2023-10-23 VITALS — BP 111/72 | HR 82 | Ht 72.0 in | Wt 237.0 lb

## 2023-10-23 DIAGNOSIS — R251 Tremor, unspecified: Secondary | ICD-10-CM | POA: Diagnosis not present

## 2023-10-23 DIAGNOSIS — G251 Drug-induced tremor: Secondary | ICD-10-CM | POA: Diagnosis not present

## 2023-10-23 DIAGNOSIS — Z5181 Encounter for therapeutic drug level monitoring: Secondary | ICD-10-CM | POA: Diagnosis not present

## 2023-10-23 DIAGNOSIS — F319 Bipolar disorder, unspecified: Secondary | ICD-10-CM

## 2023-10-23 NOTE — Patient Instructions (Signed)
 Your provider has requested that you have labwork completed today. The lab is located on the Second floor at [address], within the [facility] office. When you get off the elevator, turn right and go in the [facility] [address]; the first brown door on the left.  Tell the ladies behind the desk that you are there for lab work. If you are not called within 15 minutes please check with the front desk.   Once you complete your labs you are free to go. You will receive a call or message via MyChart with your lab results.     We discussed that your tremor is likely from the lithium .  You should discuss this medication with your prescribing provider.  It doesn't mean you are toxic or that there is a problem with the lithium  but rather that this is a side effect of the medication.  The physicians and staff at [facility] are committed to providing excellent care. You may receive a survey requesting feedback about your experience at our office. We strive to receive very good responses to the survey questions. If you feel that your experience would prevent you from giving the office a very good  response, please contact our office to try to remedy the situation. We may be reached at [phone]. Thank you for taking the time out of your busy day to complete the survey.

## 2023-10-26 ENCOUNTER — Ambulatory Visit: Payer: Self-pay | Admitting: Neurology

## 2023-10-26 LAB — LITHIUM LEVEL: Lithium Lvl: 0.4 mmol/L — ABNORMAL LOW (ref 0.6–1.2)

## 2023-10-26 LAB — TSH: TSH: 4.32 m[IU]/L (ref 0.40–4.50)

## 2023-11-15 DIAGNOSIS — Z6831 Body mass index (BMI) 31.0-31.9, adult: Secondary | ICD-10-CM | POA: Diagnosis not present

## 2023-11-15 DIAGNOSIS — E785 Hyperlipidemia, unspecified: Secondary | ICD-10-CM | POA: Diagnosis not present

## 2023-11-15 DIAGNOSIS — Z860100 Personal history of colon polyps, unspecified: Secondary | ICD-10-CM | POA: Diagnosis not present

## 2023-11-15 DIAGNOSIS — Z008 Encounter for other general examination: Secondary | ICD-10-CM | POA: Diagnosis not present

## 2023-11-15 DIAGNOSIS — E271 Primary adrenocortical insufficiency: Secondary | ICD-10-CM | POA: Diagnosis not present

## 2023-11-15 DIAGNOSIS — E669 Obesity, unspecified: Secondary | ICD-10-CM | POA: Diagnosis not present
